# Patient Record
Sex: Female | Born: 1970
Health system: Southern US, Community
[De-identification: ages and names within clinical notes are randomized; demographics above are authoritative.]

## PROBLEM LIST (undated history)

## (undated) DIAGNOSIS — Z973 Presence of spectacles and contact lenses: Secondary | ICD-10-CM

## (undated) HISTORY — PX: WISDOM TOOTH EXTRACTION: SHX21

## (undated) HISTORY — PX: CERVICAL BIOPSY  W/ LOOP ELECTRODE EXCISION: SUR135

## (undated) HISTORY — PX: ADENOIDECTOMY: SUR15

---

## 2003-12-27 ENCOUNTER — Ambulatory Visit: Payer: Self-pay

## 2008-10-12 ENCOUNTER — Ambulatory Visit: Payer: Self-pay

## 2009-12-12 ENCOUNTER — Ambulatory Visit: Payer: Self-pay

## 2010-12-17 ENCOUNTER — Ambulatory Visit: Payer: Self-pay

## 2015-10-25 ENCOUNTER — Other Ambulatory Visit: Payer: Self-pay | Admitting: Obstetrics & Gynecology

## 2015-10-25 DIAGNOSIS — Z1231 Encounter for screening mammogram for malignant neoplasm of breast: Secondary | ICD-10-CM

## 2015-11-17 ENCOUNTER — Encounter: Payer: Self-pay | Admitting: Radiology

## 2015-11-17 ENCOUNTER — Ambulatory Visit
Admission: RE | Admit: 2015-11-17 | Discharge: 2015-11-17 | Disposition: A | Discharge status: Home / Self Care | Payer: BLUE CROSS/BLUE SHIELD | Source: Ambulatory Visit | Attending: Obstetrics & Gynecology | Admitting: Obstetrics & Gynecology

## 2015-11-17 DIAGNOSIS — Z1231 Encounter for screening mammogram for malignant neoplasm of breast: Secondary | ICD-10-CM | POA: Diagnosis present

## 2016-10-22 ENCOUNTER — Ambulatory Visit: Payer: Self-pay | Admitting: Obstetrics & Gynecology

## 2016-11-08 ENCOUNTER — Ambulatory Visit (INDEPENDENT_AMBULATORY_CARE_PROVIDER_SITE_OTHER): Payer: BLUE CROSS/BLUE SHIELD | Admitting: Obstetrics & Gynecology

## 2016-11-08 ENCOUNTER — Encounter: Payer: Self-pay | Admitting: Obstetrics & Gynecology

## 2016-11-08 VITALS — BP 110/70 | HR 78 | Ht 60.0 in | Wt 194.0 lb

## 2016-11-08 DIAGNOSIS — Z131 Encounter for screening for diabetes mellitus: Secondary | ICD-10-CM

## 2016-11-08 DIAGNOSIS — Z0000 Encounter for general adult medical examination without abnormal findings: Secondary | ICD-10-CM

## 2016-11-08 DIAGNOSIS — Z1231 Encounter for screening mammogram for malignant neoplasm of breast: Secondary | ICD-10-CM | POA: Diagnosis not present

## 2016-11-08 DIAGNOSIS — Z1239 Encounter for other screening for malignant neoplasm of breast: Secondary | ICD-10-CM

## 2016-11-08 NOTE — Patient Instructions (Signed)
PAP every three years Mammogram every year    Call [phone] to schedule at [facility] Colonoscopy every 10 years after age 48

## 2016-11-08 NOTE — Progress Notes (Signed)
HPI:      [name] is a 46 y.o. [phone] who LMP was No LMP recorded. Patient is perimenopausal., she presents today for her annual examination. The patient has no complaints today. The patient is sexually active. Her last pap: approximate date 2016 and was normal and last mammogram: approximate date 2017 and was normal. The patient does perform self breast exams.  There is no notable family history of breast or ovarian cancer in her family.  The patient has regular exercise: yes.  The patient denies current symptoms of depression. Occas hot flash.  Weight still a concern but she is still trying to adjust exercise and dietary changes.    PMHx: History reviewed. No pertinent past medical history. Past Surgical History:  Procedure Laterality Date  . ADENOIDECTOMY    . CERVICAL BIOPSY  W/ LOOP ELECTRODE EXCISION    . CESAREAN SECTION    . WISDOM TOOTH EXTRACTION     Family History  Problem Relation Age of Onset  . Cancer Paternal Aunt   . Cancer Paternal Uncle   . Diabetes Father   . Alzheimer's disease Maternal Grandmother   . Cancer Maternal Grandfather        larynx neoplasm, malignant  . Heart disease Paternal Grandfather    Social History  Substance Use Topics  . Smoking status: Never Smoker  . Smokeless tobacco: Never Used  . Alcohol use No   No current outpatient prescriptions on file. Allergies: Patient has no known allergies.  Review of Systems  Constitutional: Negative for chills, fever and malaise/fatigue.  HENT: Negative for congestion, sinus pain and sore throat.   Eyes: Negative for blurred vision and pain.  Respiratory: Negative for cough and wheezing.   Cardiovascular: Negative for chest pain and leg swelling.  Gastrointestinal: Negative for abdominal pain, constipation, diarrhea, heartburn, nausea and vomiting.  Genitourinary: Negative for dysuria, frequency, hematuria and urgency.  Musculoskeletal: Negative for back pain, joint pain, myalgias and  neck pain.  Skin: Negative for itching and rash.  Neurological: Negative for dizziness, tremors and weakness.  Endo/Heme/Allergies: Does not bruise/bleed easily.  Psychiatric/Behavioral: Negative for depression. The patient is not nervous/anxious and does not have insomnia.     Objective: BP 110/70   Pulse 78   Ht 5' (1.524 m)   Wt 194 lb (88 kg)   BMI 37.89 kg/m   Filed Weights   11/08/16 1453  Weight: 194 lb (88 kg)   Body mass index is 37.89 kg/m. Physical Exam  Constitutional: She is oriented to person, place, and time. She appears well-developed and well-nourished. No distress.  Genitourinary: Rectum normal, vagina normal and uterus normal. Pelvic exam was performed with patient supine. There is no rash or lesion on the right labia. There is no rash or lesion on the left labia. Vagina exhibits no lesion. No bleeding in the vagina. Right adnexum does not display mass and does not display tenderness. Left adnexum does not display mass and does not display tenderness. Cervix does not exhibit motion tenderness, lesion, friability or polyp.   Uterus is mobile and midaxial. Uterus is not enlarged or exhibiting a mass.  HENT:  Head: Normocephalic and atraumatic. Head is without laceration.  Right Ear: Hearing normal.  Left Ear: Hearing normal.  Nose: No epistaxis.  No foreign bodies.  Mouth/Throat: Uvula is midline, oropharynx is clear and moist and mucous membranes are normal.  Eyes: Pupils are equal, round, and reactive to light.  Neck: Normal range of motion. Neck  supple. No thyromegaly present.  Cardiovascular: Normal rate and regular rhythm.  Exam reveals no gallop and no friction rub.   No murmur heard. Pulmonary/Chest: Effort normal and breath sounds normal. No respiratory distress. She has no wheezes. Right breast exhibits no mass, no skin change and no tenderness. Left breast exhibits no mass, no skin change and no tenderness.  Abdominal: Soft. Bowel sounds are normal. She  exhibits no distension. There is no tenderness. There is no rebound.  Musculoskeletal: Normal range of motion.  Neurological: She is alert and oriented to person, place, and time. No cranial nerve deficit.  Skin: Skin is warm and dry.  Psychiatric: She has a normal mood and affect. Judgment normal.  Vitals reviewed.   Assessment:  ANNUAL EXAM 1. Annual physical exam   2. Screening for diabetes mellitus   3. Screening for breast cancer    Screening Plan:            1.  Cervical Screening-  Pap smear schedule reviewed with patient  2. Breast screening- Exam annually and mammogram>40 planned   3. Colonoscopy every 10 years, Hemoccult testing - after age 83  4. Labs Ordered today  5. Counseling for contraception: no method  Other:  1. Annual physical exam  2. Screening for diabetes mellitus - Hemoglobin A1c  3. Screening for breast cancer - MM DIGITAL SCREENING BILATERAL; Future  4. Obesity.  Counseled as to options for weight loss    F/U  Return in about 1 year (around 11/08/2017) for Annual.  Barnett Applebaum, MD, [facility], [facility] 11/08/2016  3:41 PM

## 2016-11-09 LAB — HEMOGLOBIN A1C
Est. average glucose Bld gHb Est-mCnc: 128 mg/dL
Hgb A1c MFr Bld: 6.1 % — ABNORMAL HIGH (ref 4.8–5.6)

## 2016-12-05 ENCOUNTER — Ambulatory Visit
Admission: RE | Admit: 2016-12-05 | Discharge: 2016-12-05 | Disposition: A | Discharge status: Home / Self Care | Payer: BLUE CROSS/BLUE SHIELD | Source: Ambulatory Visit | Attending: Obstetrics & Gynecology | Admitting: Obstetrics & Gynecology

## 2016-12-05 DIAGNOSIS — Z1231 Encounter for screening mammogram for malignant neoplasm of breast: Secondary | ICD-10-CM | POA: Diagnosis present

## 2016-12-05 DIAGNOSIS — Z1239 Encounter for other screening for malignant neoplasm of breast: Secondary | ICD-10-CM

## 2017-07-09 ENCOUNTER — Ambulatory Visit (INDEPENDENT_AMBULATORY_CARE_PROVIDER_SITE_OTHER): Payer: BLUE CROSS/BLUE SHIELD

## 2017-07-09 ENCOUNTER — Encounter: Payer: Self-pay | Admitting: Podiatry

## 2017-07-09 ENCOUNTER — Ambulatory Visit: Payer: BLUE CROSS/BLUE SHIELD | Admitting: Podiatry

## 2017-07-09 ENCOUNTER — Ambulatory Visit: Payer: BLUE CROSS/BLUE SHIELD

## 2017-07-09 DIAGNOSIS — M722 Plantar fascial fibromatosis: Secondary | ICD-10-CM

## 2017-07-09 MED ORDER — MELOXICAM 15 MG PO TABS: 15.0000 mg | ORAL_TABLET | Freq: Every day | ORAL | 3 refills | Status: DC

## 2017-07-09 MED ORDER — METHYLPREDNISOLONE 4 MG PO TBPK: ORAL_TABLET | ORAL | 0 refills | Status: DC

## 2017-07-09 NOTE — Progress Notes (Signed)
  Subjective:  Patient ID: Susan Duncan, female    DOB: 11-08-1970,  MRN: [id number] HPI Chief Complaint  Patient presents with  . Foot Pain    Patient presents today for right heel pain x 1 month.  She reports a constant throbbing pain when she is standing or walking and pain is much worse first thing in the mornings or when first standing after sitting a while.  She has been using inserts, stretching and gel heel cups with slight relief  . New Patient (Initial Visit)    47 y.o. female presents with the above complaint.   ROS: Denies fever chills nausea vomiting muscle aches pains calf pain back pain chest pain shortness of breath and headache.  No past medical history on file. Past Surgical History:  Procedure Laterality Date  . ADENOIDECTOMY    . CERVICAL BIOPSY  W/ LOOP ELECTRODE EXCISION    . CESAREAN SECTION    . WISDOM TOOTH EXTRACTION      Current Outpatient Medications:  Marland Kitchen  Multiple Vitamin (MULTIVITAMIN) capsule, Take 1 capsule by mouth daily., Disp: , Rfl:  .  meloxicam (MOBIC) 15 MG tablet, Take 1 tablet (15 mg total) by mouth daily., Disp: 30 tablet, Rfl: 3 .  methylPREDNISolone (MEDROL DOSEPAK) 4 MG TBPK tablet, 6 day dose pack - take as directed, Disp: 21 tablet, Rfl: 0  No Known Allergies Review of Systems Objective:  There were no vitals filed for this visit.  General: Well developed, nourished, in no acute distress, alert and oriented x3   Dermatological: Skin is warm, dry and supple bilateral. Nails x 10 are well maintained; remaining integument appears unremarkable at this time. There are no open sores, no preulcerative lesions, no rash or signs of infection present.  Vascular: Dorsalis Pedis artery and Posterior Tibial artery pedal pulses are 2/4 bilateral with immedate capillary fill time. Pedal hair growth present. No varicosities and no lower extremity edema present bilateral.   Neruologic: Grossly intact via light touch bilateral. Vibratory intact via  tuning fork bilateral. Protective threshold with Semmes Wienstein monofilament intact to all pedal sites bilateral. Patellar and Achilles deep tendon reflexes 2+ bilateral. No Babinski or clonus noted bilateral.   Musculoskeletal: No gross boney pedal deformities bilateral. No pain, crepitus, or limitation noted with foot and ankle range of motion bilateral. Muscular strength 5/5 in all groups tested bilateral.  Pain on palpation medial calcaneal tubercle of the right heel.  Gait: Unassisted, Nonantalgic.    Radiographs:  Radiographs taken today demonstrate soft tissue increase in density plantar fascial calcaneal insertion site of the right heel.  No acute findings.  Assessment & Plan:   Assessment: Plantar fasciitis right heel.  Plan: Discussed etiology pathology conservative versus surgical therapies.  At this point I injected the medial aspect of the right heel with 20 mg of Kenalog and 5 mg Marcaine after sterile Betadine skin prep.  Her on a Medrol Dosepak to be followed by meloxicam.  Plantar fascial brace and a night splint was discussed.  Gust appropriate shoe gear stretching exercise ice therapy as your modifications.  I will follow-up with her in the near future for follow-up.     Max T. [place], [place]

## 2017-07-09 NOTE — Patient Instructions (Signed)

## 2017-08-13 ENCOUNTER — Encounter: Payer: Self-pay | Admitting: Podiatry

## 2017-08-13 ENCOUNTER — Ambulatory Visit: Payer: BLUE CROSS/BLUE SHIELD | Admitting: Podiatry

## 2017-08-13 DIAGNOSIS — M722 Plantar fascial fibromatosis: Secondary | ICD-10-CM

## 2017-08-13 MED ORDER — CELECOXIB 200 MG PO CAPS: 200.0000 mg | ORAL_CAPSULE | Freq: Every day | ORAL | 3 refills | Status: DC

## 2017-08-13 NOTE — Progress Notes (Signed)
She presents today for follow-up of plantar fasciitis to the right foot.  States that is really doing well at approximately 80 to 90%.  Objective: Vital signs are stable she is alert and oriented x3.  There is no erythema edema cellulitis drainage or odor she has very little in the way of pain on palpation medial calcaneal tubercle of the right heel.  Assessment: Letter fasciitis right.  Plan: Follow-up with me as needed

## 2017-11-20 ENCOUNTER — Other Ambulatory Visit: Payer: Self-pay | Admitting: Obstetrics & Gynecology

## 2017-11-20 DIAGNOSIS — Z1231 Encounter for screening mammogram for malignant neoplasm of breast: Secondary | ICD-10-CM

## 2017-11-24 ENCOUNTER — Encounter: Payer: Self-pay | Admitting: Obstetrics & Gynecology

## 2017-11-24 ENCOUNTER — Other Ambulatory Visit (HOSPITAL_COMMUNITY)
Admission: RE | Admit: 2017-11-24 | Discharge: 2017-11-24 | Disposition: A | Discharge status: Home / Self Care | Payer: BLUE CROSS/BLUE SHIELD | Source: Ambulatory Visit | Attending: Obstetrics & Gynecology | Admitting: Obstetrics & Gynecology

## 2017-11-24 ENCOUNTER — Ambulatory Visit (INDEPENDENT_AMBULATORY_CARE_PROVIDER_SITE_OTHER): Payer: BLUE CROSS/BLUE SHIELD | Admitting: Obstetrics & Gynecology

## 2017-11-24 VITALS — BP 120/80 | Ht 61.0 in | Wt 204.0 lb

## 2017-11-24 DIAGNOSIS — Z0000 Encounter for general adult medical examination without abnormal findings: Secondary | ICD-10-CM

## 2017-11-24 DIAGNOSIS — Z1329 Encounter for screening for other suspected endocrine disorder: Secondary | ICD-10-CM

## 2017-11-24 DIAGNOSIS — Z131 Encounter for screening for diabetes mellitus: Secondary | ICD-10-CM

## 2017-11-24 DIAGNOSIS — Z01419 Encounter for gynecological examination (general) (routine) without abnormal findings: Secondary | ICD-10-CM | POA: Diagnosis not present

## 2017-11-24 DIAGNOSIS — Z124 Encounter for screening for malignant neoplasm of cervix: Secondary | ICD-10-CM | POA: Diagnosis not present

## 2017-11-24 DIAGNOSIS — Z1239 Encounter for other screening for malignant neoplasm of breast: Secondary | ICD-10-CM | POA: Diagnosis not present

## 2017-11-24 DIAGNOSIS — Z13220 Encounter for screening for lipoid disorders: Secondary | ICD-10-CM

## 2017-11-24 DIAGNOSIS — Z1321 Encounter for screening for nutritional disorder: Secondary | ICD-10-CM

## 2017-11-24 NOTE — Progress Notes (Signed)
HPI:      [name] is a 47 y.o. [id number] who LMP was No LMP recorded. Patient is perimenopausal., she presents today for her annual examination. The patient has no complaints today. The patient is sexually active. Her last pap: approximate date 2016 and was normal and last mammogram: approximate date 2018 and was normal. The patient does perform self breast exams.  There is no notable family history of breast or ovarian cancer in her family.  The patient has regular exercise: yes.  The patient denies current symptoms of depression.    GYN History: Contraception: none  PMHx: History reviewed. No pertinent past medical history. Past Surgical History:  Procedure Laterality Date  . ADENOIDECTOMY    . CERVICAL BIOPSY  W/ LOOP ELECTRODE EXCISION    . CESAREAN SECTION    . WISDOM TOOTH EXTRACTION     Family History  Problem Relation Age of Onset  . Cancer Paternal Aunt   . Cancer Paternal Uncle   . Diabetes Father   . Alzheimer's disease Maternal Grandmother   . Cancer Maternal Grandfather        larynx neoplasm, malignant  . Heart disease Paternal Grandfather    Social History   Tobacco Use  . Smoking status: Never Smoker  . Smokeless tobacco: Never Used  Substance Use Topics  . Alcohol use: No  . Drug use: No    Current Outpatient Medications:  .  celecoxib (CELEBREX) 200 MG capsule, Take 1 capsule (200 mg total) by mouth daily., Disp: 30 capsule, Rfl: 3 .  meloxicam (MOBIC) 15 MG tablet, Take 1 tablet (15 mg total) by mouth daily., Disp: 30 tablet, Rfl: 3 .  Multiple Vitamin (MULTIVITAMIN) capsule, Take 1 capsule by mouth daily., Disp: , Rfl:  Allergies: Patient has no known allergies.  Review of Systems  Constitutional: Negative for chills, fever and malaise/fatigue.  HENT: Negative for congestion, sinus pain and sore throat.   Eyes: Negative for blurred vision and pain.  Respiratory: Negative for cough and wheezing.   Cardiovascular: Negative for chest pain and  leg swelling.  Gastrointestinal: Positive for constipation. Negative for abdominal pain, diarrhea, heartburn, nausea and vomiting.  Genitourinary: Negative for dysuria, frequency, hematuria and urgency.  Musculoskeletal: Negative for back pain, joint pain, myalgias and neck pain.  Skin: Negative for itching and rash.  Neurological: Negative for dizziness, tremors and weakness.  Endo/Heme/Allergies: Does not bruise/bleed easily.  Psychiatric/Behavioral: Negative for depression. The patient is not nervous/anxious and does not have insomnia.    Objective: BP 120/80   Ht 5\' 1"  (1.549 m)   Wt 204 lb (92.5 kg)   BMI 38.55 kg/m   Filed Weights   11/24/17 1601  Weight: 204 lb (92.5 kg)   Body mass index is 38.55 kg/m. Physical Exam  Constitutional: She is oriented to person, place, and time. She appears well-developed and well-nourished. No distress.  Genitourinary: Rectum normal, vagina normal and uterus normal. Pelvic exam was performed with patient supine. There is no rash or lesion on the right labia. There is no rash or lesion on the left labia. Vagina exhibits no lesion. No bleeding in the vagina. Right adnexum does not display mass and does not display tenderness. Left adnexum does not display mass and does not display tenderness. Cervix does not exhibit motion tenderness, lesion, friability or polyp.   Uterus is mobile and midaxial. Uterus is not enlarged or exhibiting a mass.  HENT:  Head: Normocephalic and atraumatic. Head is without laceration.  Right Ear: Hearing normal.  Left Ear: Hearing normal.  Nose: No epistaxis.  No foreign bodies.  Mouth/Throat: Uvula is midline, oropharynx is clear and moist and mucous membranes are normal.  Eyes: Pupils are equal, round, and reactive to light.  Neck: Normal range of motion. Neck supple. No thyromegaly present.  Cardiovascular: Normal rate and regular rhythm. Exam reveals no gallop and no friction rub.  No murmur heard. Pulmonary/Chest:  Effort normal and breath sounds normal. No respiratory distress. She has no wheezes. Right breast exhibits no mass, no skin change and no tenderness. Left breast exhibits no mass, no skin change and no tenderness.  Abdominal: Soft. Bowel sounds are normal. She exhibits no distension. There is no tenderness. There is no rebound.  Musculoskeletal: Normal range of motion.  Neurological: She is alert and oriented to person, place, and time. No cranial nerve deficit.  Skin: Skin is warm and dry.  Psychiatric: She has a normal mood and affect. Judgment normal.  Vitals reviewed.  Assessment:  ANNUAL EXAM 1. Annual physical exam   2. Screening for cervical cancer   3. Screening for breast cancer   4. Encounter for vitamin deficiency screening   5. Screening for thyroid disorder   6. Screening for diabetes mellitus   7. Screening for cholesterol level    Screening Plan:            1.  Cervical Screening-  Pap smear done today  2. Breast screening- Exam annually and mammogram>40 planned   3. Colonoscopy every 10 years, Hemoccult testing - after age 26 (next year)  4. Labs To return fasting at a later date  5. Counseling for contraception: no method  Other:  1. Annual physical exam  2. Screening for cervical cancer - Cytology - PAP  3. Screening for breast cancer  4. Encounter for vitamin deficiency screening - VITAMIN D 25 Hydroxy (Vit-D Deficiency, Fractures)  5. Screening for thyroid disorder - TSH  6. Screening for diabetes mellitus - Hemoglobin A1c  7. Screening for cholesterol level - Lipid panel  8. Flu shot at work  9. Rec dermatology evaluation  10.  Constipation options discussed    F/U  Return in about 1 year (around 11/25/2018) for Annual.  Barnett Applebaum, MD, [facility], [facility] 11/24/2017  4:23 PM

## 2017-11-24 NOTE — Patient Instructions (Addendum)
PAP every three years Mammogram every year Colonoscopy every 10 years starting 2020 Labs soon

## 2017-11-27 LAB — CYTOLOGY - PAP
Adequacy: ABSENT
DIAGNOSIS: NEGATIVE
HPV (WINDOPATH): NOT DETECTED

## 2017-11-28 ENCOUNTER — Other Ambulatory Visit: Payer: BLUE CROSS/BLUE SHIELD

## 2017-11-28 DIAGNOSIS — Z13220 Encounter for screening for lipoid disorders: Secondary | ICD-10-CM | POA: Diagnosis not present

## 2017-11-28 DIAGNOSIS — Z1329 Encounter for screening for other suspected endocrine disorder: Secondary | ICD-10-CM | POA: Diagnosis not present

## 2017-11-28 DIAGNOSIS — Z131 Encounter for screening for diabetes mellitus: Secondary | ICD-10-CM | POA: Diagnosis not present

## 2017-11-28 DIAGNOSIS — Z1321 Encounter for screening for nutritional disorder: Secondary | ICD-10-CM | POA: Diagnosis not present

## 2017-11-29 LAB — HEMOGLOBIN A1C
ESTIMATED AVERAGE GLUCOSE: 131 mg/dL
HEMOGLOBIN A1C: 6.2 % — AB (ref 4.8–5.6)

## 2017-11-29 LAB — TSH: TSH: 2.14 u[IU]/mL (ref 0.450–4.500)

## 2017-11-29 LAB — VITAMIN D 25 HYDROXY (VIT D DEFICIENCY, FRACTURES): VIT D 25 HYDROXY: 28.5 ng/mL — AB (ref 30.0–100.0)

## 2017-11-29 LAB — LIPID PANEL
Chol/HDL Ratio: 3.4 ratio (ref 0.0–4.4)
Cholesterol, Total: 203 mg/dL — ABNORMAL HIGH (ref 100–199)
HDL: 59 mg/dL (ref >39)
LDL Calculated: 120 mg/dL — ABNORMAL HIGH (ref 0–99)
Triglycerides: 122 mg/dL (ref 0–149)
VLDL Cholesterol Cal: 24 mg/dL (ref 5–40)

## 2017-12-10 ENCOUNTER — Ambulatory Visit
Admission: RE | Admit: 2017-12-10 | Discharge: 2017-12-10 | Disposition: A | Discharge status: Home / Self Care | Payer: BLUE CROSS/BLUE SHIELD | Source: Ambulatory Visit | Attending: Obstetrics & Gynecology | Admitting: Obstetrics & Gynecology

## 2017-12-10 DIAGNOSIS — Z1231 Encounter for screening mammogram for malignant neoplasm of breast: Secondary | ICD-10-CM | POA: Diagnosis not present

## 2018-03-20 DIAGNOSIS — J069 Acute upper respiratory infection, unspecified: Secondary | ICD-10-CM | POA: Diagnosis not present

## 2018-03-20 DIAGNOSIS — J209 Acute bronchitis, unspecified: Secondary | ICD-10-CM | POA: Diagnosis not present

## 2018-11-13 ENCOUNTER — Other Ambulatory Visit: Payer: Self-pay | Admitting: Obstetrics & Gynecology

## 2018-11-13 DIAGNOSIS — Z1231 Encounter for screening mammogram for malignant neoplasm of breast: Secondary | ICD-10-CM

## 2018-12-07 ENCOUNTER — Other Ambulatory Visit: Payer: Self-pay

## 2018-12-07 ENCOUNTER — Ambulatory Visit (INDEPENDENT_AMBULATORY_CARE_PROVIDER_SITE_OTHER): Payer: BC Managed Care – PPO | Admitting: Obstetrics & Gynecology

## 2018-12-07 ENCOUNTER — Encounter: Payer: Self-pay | Admitting: Obstetrics & Gynecology

## 2018-12-07 VITALS — BP 120/80 | Ht 60.0 in | Wt 201.0 lb

## 2018-12-07 DIAGNOSIS — Z01419 Encounter for gynecological examination (general) (routine) without abnormal findings: Secondary | ICD-10-CM | POA: Diagnosis not present

## 2018-12-07 DIAGNOSIS — E7800 Pure hypercholesterolemia, unspecified: Secondary | ICD-10-CM

## 2018-12-07 DIAGNOSIS — E559 Vitamin D deficiency, unspecified: Secondary | ICD-10-CM | POA: Diagnosis not present

## 2018-12-07 DIAGNOSIS — Z131 Encounter for screening for diabetes mellitus: Secondary | ICD-10-CM | POA: Diagnosis not present

## 2018-12-07 DIAGNOSIS — Z1211 Encounter for screening for malignant neoplasm of colon: Secondary | ICD-10-CM

## 2018-12-07 NOTE — Progress Notes (Signed)
HPI:      [name] is a 48 y.o. [id number] who LMP was in the past, she presents today for her annual examination.  The patient has no complaints today. The patient is sexually active. Herlast pap: approximate date 2019 and was normal and last mammogram: approximate date 2019 and was normal.  The patient does perform self breast exams.  There is no notable family history of breast or ovarian cancer in her family. The patient is not taking hormone replacement therapy. Patient denies post-menopausal vaginal bleeding.   The patient has regular exercise: yes. The patient denies current symptoms of depression.    GYN Hx: Last Colonoscopy:never   PMHx: History reviewed. No pertinent past medical history. Past Surgical History:  Procedure Laterality Date  . ADENOIDECTOMY    . CERVICAL BIOPSY  W/ LOOP ELECTRODE EXCISION    . CESAREAN SECTION    . WISDOM TOOTH EXTRACTION     Family History  Problem Relation Age of Onset  . Cancer Paternal Aunt   . Cancer Paternal Uncle   . Diabetes Father   . Alzheimer's disease Maternal Grandmother   . Cancer Maternal Grandfather        larynx neoplasm, malignant  . Heart disease Paternal Grandfather   . Breast cancer Neg Hx    Social History   Tobacco Use  . Smoking status: Never Smoker  . Smokeless tobacco: Never Used  Substance Use Topics  . Alcohol use: No  . Drug use: No    Current Outpatient Medications:  Marland Kitchen  Multiple Vitamin (MULTIVITAMIN) capsule, Take 1 capsule by mouth daily., Disp: , Rfl:  .  celecoxib (CELEBREX) 200 MG capsule, Take 1 capsule (200 mg total) by mouth daily. (Patient not taking: Reported on 12/07/2018), Disp: 30 capsule, Rfl: 3 .  meloxicam (MOBIC) 15 MG tablet, Take 1 tablet (15 mg total) by mouth daily. (Patient not taking: Reported on 12/07/2018), Disp: 30 tablet, Rfl: 3 Allergies: Patient has no known allergies.  Review of Systems  Constitutional: Negative for chills, fever and malaise/fatigue.  HENT: Negative  for congestion, sinus pain and sore throat.   Eyes: Negative for blurred vision and pain.  Respiratory: Negative for cough and wheezing.   Cardiovascular: Negative for chest pain and leg swelling.  Gastrointestinal: Negative for abdominal pain, constipation, diarrhea, heartburn, nausea and vomiting.  Genitourinary: Negative for dysuria, frequency, hematuria and urgency.  Musculoskeletal: Negative for back pain, joint pain, myalgias and neck pain.  Skin: Negative for itching and rash.  Neurological: Negative for dizziness, tremors and weakness.  Endo/Heme/Allergies: Does not bruise/bleed easily.  Psychiatric/Behavioral: Negative for depression. The patient is not nervous/anxious and does not have insomnia.     Objective: BP 120/80   Ht 5' (1.524 m)   Wt 201 lb (91.2 kg)   BMI 39.26 kg/m   Filed Weights   12/07/18 1429  Weight: 201 lb (91.2 kg)   Body mass index is 39.26 kg/m. Physical Exam Constitutional:      General: She is not in acute distress.    Appearance: She is well-developed.  Genitourinary:     Pelvic exam was performed with patient supine.     Vagina, uterus and rectum normal.     No lesions in the vagina.     No vaginal bleeding.     No cervical motion tenderness, friability, lesion or polyp.     Uterus is mobile.     Uterus is not enlarged.     No uterine mass  detected.    Uterus is midaxial.     No right or left adnexal mass present.     Right adnexa not tender.     Left adnexa not tender.  HENT:     Head: Normocephalic and atraumatic. No laceration.     Right Ear: Hearing normal.     Left Ear: Hearing normal.     Mouth/Throat:     Pharynx: Uvula midline.  Eyes:     Pupils: Pupils are equal, round, and reactive to light.  Neck:     Musculoskeletal: Normal range of motion and neck supple.     Thyroid: No thyromegaly.  Cardiovascular:     Rate and Rhythm: Normal rate and regular rhythm.     Heart sounds: No murmur. No friction rub. No gallop.    Pulmonary:     Effort: Pulmonary effort is normal. No respiratory distress.     Breath sounds: Normal breath sounds. No wheezing.  Chest:     Breasts:        Right: No mass, skin change or tenderness.        Left: No mass, skin change or tenderness.  Abdominal:     General: Bowel sounds are normal. There is no distension.     Palpations: Abdomen is soft.     Tenderness: There is no abdominal tenderness. There is no rebound.  Musculoskeletal: Normal range of motion.  Neurological:     Mental Status: She is alert and oriented to person, place, and time.     Cranial Nerves: No cranial nerve deficit.  Skin:    General: Skin is warm and dry.  Psychiatric:        Judgment: Judgment normal.  Vitals signs reviewed.     Assessment: Annual Exam 1. Women's annual routine gynecological examination   2. Screening for diabetes mellitus   3. Hypercholesterolemia   4. Vitamin D deficiency   5. Screen for colon cancer     Plan:            1.  Cervical Screening-  Pap smear schedule reviewed with patient  2. Breast screening- Exam annually and mammogram scheduled  3. Colonoscopy every 10 years, Hemoccult testing after age 86  4. Labs Ordered today  5. Counseling for hormonal therapy: none              6. Dermatology recommended for screening exam    F/U  Return in about 1 year (around 12/07/2019) for Annual.  Barnett Applebaum, MD, [facility], [facility] 12/07/2018  3:17 PM

## 2018-12-07 NOTE — Patient Instructions (Signed)
PAP every three years Mammogram every year Colonoscopy every 10 years Labs today

## 2018-12-08 LAB — LIPID PANEL
Chol/HDL Ratio: 4.4 ratio (ref 0.0–4.4)
Cholesterol, Total: 222 mg/dL — ABNORMAL HIGH (ref 100–199)
HDL: 50 mg/dL (ref >39)
LDL Chol Calc (NIH): 120 mg/dL — ABNORMAL HIGH (ref 0–99)
Triglycerides: 296 mg/dL — ABNORMAL HIGH (ref 0–149)
VLDL Cholesterol Cal: 52 mg/dL — ABNORMAL HIGH (ref 5–40)

## 2018-12-08 LAB — VITAMIN D 25 HYDROXY (VIT D DEFICIENCY, FRACTURES): Vit D, 25-Hydroxy: 31.9 ng/mL (ref 30.0–100.0)

## 2018-12-08 LAB — HEMOGLOBIN A1C
Est. average glucose Bld gHb Est-mCnc: 128 mg/dL
Hgb A1c MFr Bld: 6.1 % — ABNORMAL HIGH (ref 4.8–5.6)

## 2018-12-14 ENCOUNTER — Telehealth: Payer: Self-pay

## 2018-12-14 ENCOUNTER — Other Ambulatory Visit: Payer: Self-pay

## 2018-12-14 NOTE — Telephone Encounter (Signed)
Gastroenterology Pre-Procedure Review  Request Date: PENDING PT CALL BACK Requesting Physician: Dr. [name]  PATIENT REVIEW QUESTIONS: The patient responded to the following health history questions as indicated:    1. Are you having any GI issues? no 2. Do you have a personal history of Polyps? no 3. Do you have a family history of Colon Cancer or Polyps? no 4. Diabetes Mellitus? no 5. Joint replacements in the past 12 months?no 6. Major health problems in the past 3 months?no 7. Any artificial heart valves, MVP, or defibrillator?no    MEDICATIONS & ALLERGIES:    Patient reports the following regarding taking any anticoagulation/antiplatelet therapy:   Plavix, Coumadin, Eliquis, Xarelto, Lovenox, Pradaxa, Brilinta, or Effient? no Aspirin? no  Patient confirms/reports the following medications:  Current Outpatient Medications  Medication Sig Dispense Refill  . celecoxib (CELEBREX) 200 MG capsule Take 1 capsule (200 mg total) by mouth daily. (Patient not taking: Reported on 12/07/2018) 30 capsule 3  . meloxicam (MOBIC) 15 MG tablet Take 1 tablet (15 mg total) by mouth daily. (Patient not taking: Reported on 12/07/2018) 30 tablet 3  . Multiple Vitamin (MULTIVITAMIN) capsule Take 1 capsule by mouth daily.     No current facility-administered medications for this visit.     Patient confirms/reports the following allergies:  No Known Allergies  No orders of the defined types were placed in this encounter.   AUTHORIZATION INFORMATION Primary Insurance: 1D#: Group #:  Secondary Insurance: 1D#: Group #:  SCHEDULE INFORMATION: Date:  Time: Location:

## 2018-12-23 ENCOUNTER — Ambulatory Visit
Admission: RE | Admit: 2018-12-23 | Discharge: 2018-12-23 | Disposition: A | Discharge status: Home / Self Care | Payer: BC Managed Care – PPO | Source: Ambulatory Visit | Attending: Obstetrics & Gynecology | Admitting: Obstetrics & Gynecology

## 2018-12-23 DIAGNOSIS — Z1231 Encounter for screening mammogram for malignant neoplasm of breast: Secondary | ICD-10-CM | POA: Insufficient documentation

## 2018-12-24 ENCOUNTER — Encounter: Payer: Self-pay | Admitting: *Deleted

## 2019-11-24 ENCOUNTER — Other Ambulatory Visit: Payer: Self-pay | Admitting: Obstetrics & Gynecology

## 2019-11-24 DIAGNOSIS — Z1231 Encounter for screening mammogram for malignant neoplasm of breast: Secondary | ICD-10-CM

## 2019-12-10 ENCOUNTER — Other Ambulatory Visit: Payer: Self-pay

## 2019-12-10 ENCOUNTER — Ambulatory Visit (INDEPENDENT_AMBULATORY_CARE_PROVIDER_SITE_OTHER): Payer: BC Managed Care – PPO | Admitting: Obstetrics & Gynecology

## 2019-12-10 ENCOUNTER — Encounter: Payer: Self-pay | Admitting: Obstetrics & Gynecology

## 2019-12-10 VITALS — BP 120/80 | Ht 60.0 in | Wt 186.0 lb

## 2019-12-10 DIAGNOSIS — E7800 Pure hypercholesterolemia, unspecified: Secondary | ICD-10-CM

## 2019-12-10 DIAGNOSIS — E559 Vitamin D deficiency, unspecified: Secondary | ICD-10-CM

## 2019-12-10 DIAGNOSIS — Z01419 Encounter for gynecological examination (general) (routine) without abnormal findings: Secondary | ICD-10-CM | POA: Diagnosis not present

## 2019-12-10 DIAGNOSIS — Z1329 Encounter for screening for other suspected endocrine disorder: Secondary | ICD-10-CM

## 2019-12-10 DIAGNOSIS — Z131 Encounter for screening for diabetes mellitus: Secondary | ICD-10-CM | POA: Diagnosis not present

## 2019-12-10 NOTE — Progress Notes (Signed)
HPI:      [name] is a 49 y.o. [id number] who LMP was in the past, she presents today for her annual examination.  The patient has no complaints today. The patient is sexually active. Herlast pap: approximate date 2019 and was normal and last mammogram: approximate date 2020 and was normal.  The patient does perform self breast exams.  There is no notable family history of breast or ovarian cancer in her family. The patient is not taking hormone replacement therapy. Patient denies post-menopausal vaginal bleeding.   The patient has regular exercise: yes. The patient denies current symptoms of depression.    GYN Hx: Colonoscopy:Overdue, plans to early 2022.   PMHx: History reviewed. No pertinent past medical history. Past Surgical History:  Procedure Laterality Date  . ADENOIDECTOMY    . CERVICAL BIOPSY  W/ LOOP ELECTRODE EXCISION    . CESAREAN SECTION    . WISDOM TOOTH EXTRACTION     Family History  Problem Relation Age of Onset  . Cancer Paternal Aunt   . Cancer Paternal Uncle   . Diabetes Father   . Alzheimer's disease Maternal Grandmother   . Cancer Maternal Grandfather        larynx neoplasm, malignant  . Heart disease Paternal Grandfather   . Breast cancer Neg Hx    Social History   Tobacco Use  . Smoking status: Never Smoker  . Smokeless tobacco: Never Used  Vaping Use  . Vaping Use: Never used  Substance Use Topics  . Alcohol use: No  . Drug use: No    Current Outpatient Medications:  Marland Kitchen  Multiple Vitamin (MULTIVITAMIN) capsule, Take 1 capsule by mouth daily., Disp: , Rfl:  Allergies: Patient has no known allergies.  Review of Systems  Constitutional: Negative for chills, fever and malaise/fatigue.  HENT: Negative for congestion, sinus pain and sore throat.   Eyes: Negative for blurred vision and pain.  Respiratory: Negative for cough and wheezing.   Cardiovascular: Negative for chest pain and leg swelling.  Gastrointestinal: Negative for abdominal pain,  constipation, diarrhea, heartburn, nausea and vomiting.  Genitourinary: Negative for dysuria, frequency, hematuria and urgency.  Musculoskeletal: Negative for back pain, joint pain, myalgias and neck pain.  Skin: Negative for itching and rash.  Neurological: Negative for dizziness, tremors and weakness.  Endo/Heme/Allergies: Does not bruise/bleed easily.  Psychiatric/Behavioral: Negative for depression. The patient is not nervous/anxious and does not have insomnia.     Objective: BP 120/80   Ht 5' (1.524 m)   Wt 186 lb (84.4 kg)   LMP 09/17/2015   BMI 36.33 kg/m   Filed Weights   12/10/19 1519  Weight: 186 lb (84.4 kg)   Body mass index is 36.33 kg/m. Physical Exam Constitutional:      General: She is not in acute distress.    Appearance: She is well-developed.  Genitourinary:     Pelvic exam was performed with patient supine.     Vagina, uterus and rectum normal.     No lesions in the vagina.     No vaginal bleeding.     No cervical motion tenderness, friability, lesion or polyp.     Uterus is mobile.     Uterus is not enlarged.     No uterine mass detected.    Uterus is midaxial.     No right or left adnexal mass present.     Right adnexa not tender.     Left adnexa not tender.  HENT:  Head: Normocephalic and atraumatic. No laceration.     Right Ear: Hearing normal.     Left Ear: Hearing normal.     Mouth/Throat:     Pharynx: Uvula midline.  Eyes:     Pupils: Pupils are equal, round, and reactive to light.  Neck:     Thyroid: No thyromegaly.  Cardiovascular:     Rate and Rhythm: Normal rate and regular rhythm.     Heart sounds: No murmur heard.  No friction rub. No gallop.   Pulmonary:     Effort: Pulmonary effort is normal. No respiratory distress.     Breath sounds: Normal breath sounds. No wheezing.  Chest:     Breasts:        Right: No mass, skin change or tenderness.        Left: No mass, skin change or tenderness.  Abdominal:     General: Bowel  sounds are normal. There is no distension.     Palpations: Abdomen is soft.     Tenderness: There is no abdominal tenderness. There is no rebound.  Musculoskeletal:        General: Normal range of motion.     Cervical back: Normal range of motion and neck supple.  Neurological:     Mental Status: She is alert and oriented to person, place, and time.     Cranial Nerves: No cranial nerve deficit.  Skin:    General: Skin is warm and dry.  Psychiatric:        Judgment: Judgment normal.  Vitals reviewed.     Assessment: Annual Exam 1. Women's annual routine gynecological examination   2. Screening for diabetes mellitus   3. Hypercholesterolemia   4. Vitamin D deficiency   5. Screening for thyroid disorder     Plan:            1.  Cervical Screening-  Pap smear schedule reviewed with patient  2. Breast screening- Exam annually and mammogram scheduled  3. Colonoscopy every 10 years, Hemoccult testing after age 59  4. Labs To return fasting at a later date  5. Counseling for hormonal therapy: none              6. FRAX - FRAX score for assessing the 10 year probability for fracture calculated and discussed today.  Based on age and score today, DEXA is not currently scheduled.    F/U  Return in about 1 year (around 12/09/2020) for Annual, and LAB APPT SOON.  Barnett Applebaum, MD, [facility], [facility] 12/10/2019  4:03 PM

## 2019-12-10 NOTE — Patient Instructions (Signed)
PAP every three years Mammogram every year Colonoscopy every 10 years Labs  Thank you for choosing [facility]. As part of our ongoing efforts to improve patient experience, we would appreciate your feedback. Please fill out the short survey that you will receive by mail or MyChart. Your opinion is important to [place]! - Dr. Kenton Kingfisher

## 2019-12-15 ENCOUNTER — Other Ambulatory Visit: Payer: Self-pay

## 2019-12-15 ENCOUNTER — Other Ambulatory Visit: Payer: BC Managed Care – PPO

## 2019-12-15 DIAGNOSIS — E559 Vitamin D deficiency, unspecified: Secondary | ICD-10-CM | POA: Diagnosis not present

## 2019-12-15 DIAGNOSIS — Z131 Encounter for screening for diabetes mellitus: Secondary | ICD-10-CM

## 2019-12-15 DIAGNOSIS — Z1329 Encounter for screening for other suspected endocrine disorder: Secondary | ICD-10-CM

## 2019-12-15 DIAGNOSIS — E7800 Pure hypercholesterolemia, unspecified: Secondary | ICD-10-CM | POA: Diagnosis not present

## 2019-12-16 LAB — LIPID PANEL
Chol/HDL Ratio: 3.6 ratio (ref 0.0–4.4)
Cholesterol, Total: 193 mg/dL (ref 100–199)
HDL: 54 mg/dL (ref >39)
LDL Chol Calc (NIH): 111 mg/dL — ABNORMAL HIGH (ref 0–99)
Triglycerides: 163 mg/dL — ABNORMAL HIGH (ref 0–149)
VLDL Cholesterol Cal: 28 mg/dL (ref 5–40)

## 2019-12-16 LAB — TSH: TSH: 2.29 u[IU]/mL (ref 0.450–4.500)

## 2019-12-16 LAB — VITAMIN D 25 HYDROXY (VIT D DEFICIENCY, FRACTURES): Vit D, 25-Hydroxy: 44.6 ng/mL (ref 30.0–100.0)

## 2019-12-16 LAB — HEMOGLOBIN A1C
Est. average glucose Bld gHb Est-mCnc: 126 mg/dL
Hgb A1c MFr Bld: 6 % — ABNORMAL HIGH (ref 4.8–5.6)

## 2019-12-24 ENCOUNTER — Other Ambulatory Visit: Payer: Self-pay

## 2019-12-24 ENCOUNTER — Ambulatory Visit
Admission: RE | Admit: 2019-12-24 | Discharge: 2019-12-24 | Disposition: A | Discharge status: Home / Self Care | Payer: BC Managed Care – PPO | Source: Ambulatory Visit | Attending: Obstetrics & Gynecology | Admitting: Obstetrics & Gynecology

## 2019-12-24 DIAGNOSIS — Z1231 Encounter for screening mammogram for malignant neoplasm of breast: Secondary | ICD-10-CM | POA: Diagnosis not present

## 2020-01-04 DIAGNOSIS — D123 Benign neoplasm of transverse colon: Secondary | ICD-10-CM | POA: Diagnosis not present

## 2020-03-01 DIAGNOSIS — B028 Zoster with other complications: Secondary | ICD-10-CM | POA: Diagnosis not present

## 2020-03-09 DIAGNOSIS — B028 Zoster with other complications: Secondary | ICD-10-CM | POA: Diagnosis not present

## 2020-03-12 DIAGNOSIS — B029 Zoster without complications: Secondary | ICD-10-CM | POA: Diagnosis not present

## 2020-03-20 ENCOUNTER — Telehealth: Payer: Self-pay | Admitting: Gastroenterology

## 2020-03-20 NOTE — Telephone Encounter (Signed)
Called patient to explain that they need a new referral from their PCP before we can make an appointment.  Unable to reach or LM because VM is not set up

## 2020-03-27 ENCOUNTER — Telehealth: Payer: Self-pay | Admitting: Gastroenterology

## 2020-03-27 NOTE — Telephone Encounter (Signed)
Patient called LM on VM to call back.  Attempted to call patient, unable to leave a message because the voicemail is full.

## 2020-04-04 ENCOUNTER — Other Ambulatory Visit: Payer: Self-pay | Admitting: Obstetrics & Gynecology

## 2020-04-04 ENCOUNTER — Telehealth: Payer: Self-pay

## 2020-04-04 DIAGNOSIS — Z1211 Encounter for screening for malignant neoplasm of colon: Secondary | ICD-10-CM

## 2020-04-04 NOTE — Telephone Encounter (Signed)
Pt aware.

## 2020-04-04 NOTE — Telephone Encounter (Signed)
Pt calling; needs updated/new referral for colonoscopy; has called [place] office 4 times to schedule and they have not responded; pt guessing it's because the ref is old.  C# [phone] (VM full)  W# [phone] ok to leave vm

## 2020-05-01 ENCOUNTER — Encounter: Payer: Self-pay | Admitting: *Deleted

## 2020-05-08 ENCOUNTER — Other Ambulatory Visit: Payer: Self-pay

## 2020-05-08 ENCOUNTER — Telehealth (INDEPENDENT_AMBULATORY_CARE_PROVIDER_SITE_OTHER): Payer: Self-pay | Admitting: Gastroenterology

## 2020-05-08 DIAGNOSIS — Z1211 Encounter for screening for malignant neoplasm of colon: Secondary | ICD-10-CM

## 2020-05-08 MED ORDER — PEG 3350-KCL-NA BICARB-NACL 420 G PO SOLR: 4000.0000 mL | Freq: Once | ORAL | 0 refills | Status: AC

## 2020-05-08 NOTE — Progress Notes (Signed)
Gastroenterology Pre-Procedure Review  Request Date: 05/18/20 Requesting Physician: Dr. [name]  PATIENT REVIEW QUESTIONS: The patient responded to the following health history questions as indicated:    1. Are you having any GI issues? no 2. Do you have a personal history of Polyps? no 3. Do you have a family history of Colon Cancer or Polyps? no 4. Diabetes Mellitus? no 5. Joint replacements in the past 12 months?no 6. Major health problems in the past 3 months?no 7. Any artificial heart valves, MVP, or defibrillator?no    MEDICATIONS & ALLERGIES:    Patient reports the following regarding taking any anticoagulation/antiplatelet therapy:   Plavix, Coumadin, Eliquis, Xarelto, Lovenox, Pradaxa, Brilinta, or Effient? no Aspirin? no  Patient confirms/reports the following medications:  Current Outpatient Medications  Medication Sig Dispense Refill  . Multiple Vitamin (MULTIVITAMIN) capsule Take 1 capsule by mouth daily.    . polyethylene glycol-electrolytes (NULYTELY) 420 g solution Take 4,000 mLs by mouth once for 1 dose. Fill container to the fill line with clear liquid.  Mix well.  Drink 8 oz every 30 minutes until entire contents have been completed.  Do not eat or drink anything 4 hours before colonoscopy. 4000 mL 0   No current facility-administered medications for this visit.    Patient confirms/reports the following allergies:  No Known Allergies  Orders Placed This Encounter  Procedures  . Procedural/ Surgical Case Request: COLONOSCOPY WITH PROPOFOL    Standing Status:   Standing    Number of Occurrences:   1    Order Specific Question:   Pre-op diagnosis    Answer:   screening colonoscopy    Order Specific Question:   CPT Code    Answer:   34742    AUTHORIZATION INFORMATION Primary Insurance: 1D#: Group #:  Secondary Insurance: 1D#: Group #:  SCHEDULE INFORMATION: Date: 05/18/20 Time: Location:[facility]

## 2020-05-11 ENCOUNTER — Other Ambulatory Visit: Payer: Self-pay

## 2020-05-11 ENCOUNTER — Encounter: Payer: Self-pay | Admitting: Gastroenterology

## 2020-05-16 ENCOUNTER — Other Ambulatory Visit
Admission: RE | Admit: 2020-05-16 | Discharge: 2020-05-16 | Disposition: A | Discharge status: Home / Self Care | Payer: BC Managed Care – PPO | Source: Ambulatory Visit | Attending: Gastroenterology | Admitting: Gastroenterology

## 2020-05-16 ENCOUNTER — Other Ambulatory Visit: Payer: Self-pay

## 2020-05-16 DIAGNOSIS — Z20822 Contact with and (suspected) exposure to covid-19: Secondary | ICD-10-CM | POA: Insufficient documentation

## 2020-05-16 DIAGNOSIS — Z1211 Encounter for screening for malignant neoplasm of colon: Secondary | ICD-10-CM | POA: Diagnosis not present

## 2020-05-16 DIAGNOSIS — Z79899 Other long term (current) drug therapy: Secondary | ICD-10-CM | POA: Diagnosis not present

## 2020-05-16 DIAGNOSIS — Z01812 Encounter for preprocedural laboratory examination: Secondary | ICD-10-CM | POA: Insufficient documentation

## 2020-05-16 DIAGNOSIS — Z809 Family history of malignant neoplasm, unspecified: Secondary | ICD-10-CM | POA: Diagnosis not present

## 2020-05-16 DIAGNOSIS — Z833 Family history of diabetes mellitus: Secondary | ICD-10-CM | POA: Diagnosis not present

## 2020-05-16 DIAGNOSIS — Z8249 Family history of ischemic heart disease and other diseases of the circulatory system: Secondary | ICD-10-CM | POA: Diagnosis not present

## 2020-05-16 DIAGNOSIS — Z820 Family history of epilepsy and other diseases of the nervous system: Secondary | ICD-10-CM | POA: Diagnosis not present

## 2020-05-16 DIAGNOSIS — K640 First degree hemorrhoids: Secondary | ICD-10-CM | POA: Diagnosis not present

## 2020-05-16 DIAGNOSIS — Z802 Family history of malignant neoplasm of other respiratory and intrathoracic organs: Secondary | ICD-10-CM | POA: Diagnosis not present

## 2020-05-16 DIAGNOSIS — K5730 Diverticulosis of large intestine without perforation or abscess without bleeding: Secondary | ICD-10-CM | POA: Diagnosis not present

## 2020-05-16 LAB — SARS CORONAVIRUS 2 (TAT 6-24 HRS): SARS Coronavirus 2: NEGATIVE

## 2020-05-17 NOTE — Discharge Instructions (Signed)

## 2020-05-18 ENCOUNTER — Ambulatory Visit
Admission: RE | Admit: 2020-05-18 | Discharge: 2020-05-18 | Disposition: A | Discharge status: Home / Self Care | Payer: BC Managed Care – PPO | Attending: Gastroenterology | Admitting: Gastroenterology

## 2020-05-18 ENCOUNTER — Ambulatory Visit: Payer: BC Managed Care – PPO | Admitting: Anesthesiology

## 2020-05-18 ENCOUNTER — Encounter: Payer: Self-pay | Admitting: Gastroenterology

## 2020-05-18 ENCOUNTER — Encounter
Admission: RE | Disposition: A | Discharge status: Home / Self Care | Payer: Self-pay | Source: Home / Self Care | Attending: Gastroenterology

## 2020-05-18 ENCOUNTER — Other Ambulatory Visit: Payer: Self-pay

## 2020-05-18 DIAGNOSIS — K5730 Diverticulosis of large intestine without perforation or abscess without bleeding: Secondary | ICD-10-CM | POA: Insufficient documentation

## 2020-05-18 DIAGNOSIS — Z833 Family history of diabetes mellitus: Secondary | ICD-10-CM | POA: Diagnosis not present

## 2020-05-18 DIAGNOSIS — Z8249 Family history of ischemic heart disease and other diseases of the circulatory system: Secondary | ICD-10-CM | POA: Diagnosis not present

## 2020-05-18 DIAGNOSIS — Z820 Family history of epilepsy and other diseases of the nervous system: Secondary | ICD-10-CM | POA: Insufficient documentation

## 2020-05-18 DIAGNOSIS — Z802 Family history of malignant neoplasm of other respiratory and intrathoracic organs: Secondary | ICD-10-CM | POA: Insufficient documentation

## 2020-05-18 DIAGNOSIS — Z20822 Contact with and (suspected) exposure to covid-19: Secondary | ICD-10-CM | POA: Insufficient documentation

## 2020-05-18 DIAGNOSIS — Z1211 Encounter for screening for malignant neoplasm of colon: Secondary | ICD-10-CM | POA: Diagnosis not present

## 2020-05-18 DIAGNOSIS — K640 First degree hemorrhoids: Secondary | ICD-10-CM | POA: Insufficient documentation

## 2020-05-18 DIAGNOSIS — Z79899 Other long term (current) drug therapy: Secondary | ICD-10-CM | POA: Diagnosis not present

## 2020-05-18 DIAGNOSIS — Z809 Family history of malignant neoplasm, unspecified: Secondary | ICD-10-CM | POA: Diagnosis not present

## 2020-05-18 HISTORY — PX: COLONOSCOPY WITH PROPOFOL: SHX5780

## 2020-05-18 HISTORY — DX: Presence of spectacles and contact lenses: Z97.3

## 2020-05-18 SURGERY — COLONOSCOPY WITH PROPOFOL
Anesthesia: General | Site: Rectum

## 2020-05-18 MED ORDER — LACTATED RINGERS IV SOLN: INTRAVENOUS | Status: DC

## 2020-05-18 MED ORDER — PROPOFOL 10 MG/ML IV BOLUS
INTRAVENOUS | Status: DC | PRN
  Administered 2020-05-18 (×2): 50 mg via INTRAVENOUS
  Administered 2020-05-18: 100 mg via INTRAVENOUS

## 2020-05-18 MED ORDER — LIDOCAINE HCL (CARDIAC) PF 100 MG/5ML IV SOSY
PREFILLED_SYRINGE | INTRAVENOUS | Status: DC | PRN
  Administered 2020-05-18: 50 mg via INTRAVENOUS

## 2020-05-18 MED ORDER — SODIUM CHLORIDE 0.9 % IV SOLN: INTRAVENOUS | Status: DC

## 2020-05-18 MED ORDER — STERILE WATER FOR IRRIGATION IR SOLN: Status: DC | PRN

## 2020-05-18 SURGICAL SUPPLY — 6 items
GOWN CVR UNV OPN BCK APRN NK (MISCELLANEOUS) ×2 IMPLANT
GOWN ISOL THUMB LOOP REG UNIV (MISCELLANEOUS) ×4
KIT PRC NS LF DISP ENDO (KITS) ×1 IMPLANT
KIT PROCEDURE OLYMPUS (KITS) ×2
MANIFOLD NEPTUNE II (INSTRUMENTS) ×2 IMPLANT
WATER STERILE IRR 250ML POUR (IV SOLUTION) ×2 IMPLANT

## 2020-05-18 NOTE — Transfer of Care (Signed)
Immediate Anesthesia Transfer of Care Note  Patient: Susan Duncan  Procedure(s) Performed: COLONOSCOPY WITH PROPOFOL (N/A Rectum)  Patient Location: PACU  Anesthesia Type: General  Level of Consciousness: awake, alert  and patient cooperative  Airway and Oxygen Therapy: Patient Spontanous Breathing and Patient connected to supplemental oxygen  Post-op Assessment: Post-op Vital signs reviewed, Patient's Cardiovascular Status Stable, Respiratory Function Stable, Patent Airway and No signs of Nausea or vomiting  Post-op Vital Signs: Reviewed and stable  Complications: No complications documented.

## 2020-05-18 NOTE — Anesthesia Preprocedure Evaluation (Addendum)
Anesthesia Evaluation  Patient identified by MRN, date of birth, ID band  Reviewed: Allergy & Precautions, NPO status , Patient's Chart, lab work & pertinent test results  Airway Mallampati: II  TM Distance: >3 FB Neck ROM: Full    Dental no notable dental hx.    Pulmonary neg pulmonary ROS,    Pulmonary exam normal        Cardiovascular negative cardio ROS Normal cardiovascular exam     Neuro/Psych negative neurological ROS  negative psych ROS   GI/Hepatic negative GI ROS, Neg liver ROS, Screening colonscopy   Endo/Other  negative endocrine ROSBMI 36  Renal/GU negative Renal ROS  negative genitourinary   Musculoskeletal negative musculoskeletal ROS (+)   Abdominal (+) + obese,   Peds  Hematology negative hematology ROS (+)   Anesthesia Other Findings   Reproductive/Obstetrics negative OB ROS                             Anesthesia Physical Anesthesia Plan  ASA: II  Anesthesia Plan: General   Post-op Pain Management:    Induction: Intravenous  PONV Risk Score and Plan: 3 and TIVA, Propofol infusion and Treatment may vary due to age or medical condition  Airway Management Planned: Natural Airway  Additional Equipment:   Intra-op Plan:   Post-operative Plan:   Informed Consent: I have reviewed the patients History and Physical, chart, labs and discussed the procedure including the risks, benefits and alternatives for the proposed anesthesia with the patient or authorized representative who has indicated his/her understanding and acceptance.     Dental advisory given  Plan Discussed with: CRNA  Anesthesia Plan Comments:         Anesthesia Quick Evaluation

## 2020-05-18 NOTE — H&P (Signed)
Susan Lame, MD [address] [name], [name] [address] Phone: [phone] Fax : [phone]  Primary Care Physician:  [name], MD Primary Gastroenterologist:  Dr. [name]  Pre-Procedure History & Physical: HPI:  [name] is a 50 y.o. female is here for a screening colonoscopy.   Past Medical History:  Diagnosis Date  . Wears contact lenses    sometimes    Past Surgical History:  Procedure Laterality Date  . ADENOIDECTOMY    . CERVICAL BIOPSY  W/ LOOP ELECTRODE EXCISION    . CESAREAN SECTION    . WISDOM TOOTH EXTRACTION      Prior to Admission medications   Medication Sig Start Date End Date Taking? Authorizing Provider  Ascorbic Acid (VITAMIN C PO) Take by mouth daily.   Yes [provider]  BIOTIN PO Take by mouth daily.   Yes [provider]  Multiple Vitamin (MULTIVITAMIN) capsule Take 1 capsule by mouth daily.   Yes [provider]  Multiple Vitamins-Minerals (ZINC PO) Take by mouth daily.   Yes [provider]  VITAMIN D PO Take by mouth daily.   Yes [provider]  VITAMIN E PO Take by mouth daily.   Yes [provider]    Allergies as of 05/08/2020  . (No Known Allergies)    Family History  Problem Relation Age of Onset  . Cancer Paternal Aunt   . Cancer Paternal Uncle   . Diabetes Father   . Alzheimer's disease Maternal Grandmother   . Cancer Maternal Grandfather        larynx neoplasm, malignant  . Heart disease Paternal Grandfather   . Breast cancer Neg Hx     Social History   Socioeconomic History  . Marital status: Married    Spouse name: Not on file  . Number of children: Not on file  . Years of education: Not on file  . Highest education level: Not on file  Occupational History  . Not on file  Tobacco Use  . Smoking status: Never Smoker  . Smokeless tobacco: Never Used  Vaping Use  . Vaping Use: Never used  Substance and Sexual Activity  . Alcohol  use: No  . Drug use: No  . Sexual activity: Yes  Other Topics Concern  . Not on file  Social History Narrative  . Not on file   Social Determinants of Health   Financial Resource Strain: Not on file  Food Insecurity: Not on file  Transportation Needs: Not on file  Physical Activity: Not on file  Stress: Not on file  Social Connections: Not on file  Intimate Partner Violence: Not on file    Review of Systems: See HPI, otherwise negative ROS  Physical Exam: BP (!) 139/94   Pulse (!) 107   Temp (!) 97.2 F (36.2 C)   Ht 5' (1.524 m)   Wt 84 kg   LMP 09/17/2015   SpO2 94%   BMI 36.17 kg/m  General:   Alert,  pleasant and cooperative in NAD Head:  Normocephalic and atraumatic. Neck:  Supple; no masses or thyromegaly. Lungs:  Clear throughout to auscultation.    Heart:  Regular rate and rhythm. Abdomen:  Soft, nontender and nondistended. Normal bowel sounds, without guarding, and without rebound.   Neurologic:  Alert and  oriented x4;  grossly normal neurologically.  Impression/Plan: Susan Duncan is now here to undergo a screening colonoscopy.  Risks, benefits, and alternatives regarding colonoscopy have been  reviewed with the patient.  Questions have been answered.  All parties agreeable.

## 2020-05-18 NOTE — Op Note (Signed)
[facility] Patient Name: Susan Duncan Procedure Date: 05/18/2020 8:10 AM MRN: [id number] Account #: [id number] Date of Birth: 06/28/1970 Admit Type: Outpatient Age: 50 Room: [facility] OR ROOM 01 Gender: Female Note Status: Finalized Procedure:             Colonoscopy Indications:           Screening for colorectal malignant neoplasm Providers:             [name] MD, MD Referring MD:          [name], MD (Referring MD) Medicines:             Propofol per Anesthesia Complications:         No immediate complications. Procedure:             Pre-Anesthesia Assessment:                        - Prior to the procedure, a History and Physical was                         performed, and patient medications and allergies were                         reviewed. The patient's tolerance of previous                         anesthesia was also reviewed. The risks and benefits                         of the procedure and the sedation options and risks                         were discussed with the patient. All questions were                         answered, and informed consent was obtained. Prior                         Anticoagulants: The patient has taken no previous                         anticoagulant or antiplatelet agents. ASA Grade                         Assessment: II - A patient with mild systemic disease.                         After reviewing the risks and benefits, the patient                         was deemed in satisfactory condition to undergo the                         procedure.                        After obtaining informed consent, the colonoscope was  passed under direct vision. Throughout the procedure,                         the patient's blood pressure, pulse, and oxygen                         saturations were monitored continuously. The                         Colonoscope was introduced through the  anus and                         advanced to the the cecum, identified by appendiceal                         orifice and ileocecal valve. The colonoscopy was                         performed without difficulty. The patient tolerated                         the procedure well. The quality of the bowel                         preparation was excellent. Findings:      The perianal and digital rectal examinations were normal.      Non-bleeding internal hemorrhoids were found during retroflexion. The       hemorrhoids were Grade I (internal hemorrhoids that do not prolapse).      A few small-mouthed diverticula were found in the entire colon. Impression:            - Non-bleeding internal hemorrhoids.                        - Diverticulosis in the entire examined colon.                        - No specimens collected. Recommendation:        - Discharge patient to home.                        - Resume previous diet.                        - Continue present medications.                        - Repeat colonoscopy in 10 years for screening                         purposes. Procedure Code(s):     --- Professional ---                        [phone], Colonoscopy, flexible; diagnostic, including                         collection of specimen(s) by brushing or washing, when                         performed (separate  procedure) Diagnosis Code(s):     --- Professional ---                        Z12.11, Encounter for screening for malignant neoplasm                         of colon CPT copyright 2019 American Medical Association. All rights reserved. The codes documented in this report are preliminary and upon coder review may  be revised to meet current compliance requirements. [name] MD, MD 05/18/2020 8:39:18 AM This report has been signed electronically. Number of Addenda: 0 Note Initiated On: 05/18/2020 8:10 AM Scope Withdrawal Time: 0 hours 7 minutes 45 seconds  Total Procedure Duration: 0  hours 12 minutes 36 seconds  Estimated Blood Loss:  Estimated blood loss: none. Estimated blood loss: none.      [facility]

## 2020-05-18 NOTE — Anesthesia Procedure Notes (Signed)
Procedure Name: General with mask airway Date/Time: 05/18/2020 8:29 AM Performed by: Silvana Newness, CRNA Pre-anesthesia Checklist: Patient identified, Emergency Drugs available, Suction available, Timeout performed and Patient being monitored Patient Re-evaluated:Patient Re-evaluated prior to induction Oxygen Delivery Method: Nasal cannula Preoxygenation: Pre-oxygenation with 100% oxygen

## 2020-05-18 NOTE — Anesthesia Postprocedure Evaluation (Signed)
Anesthesia Post Note  Patient: Susan Duncan  Procedure(s) Performed: COLONOSCOPY WITH PROPOFOL (N/A Rectum)     Patient location during evaluation: PACU Anesthesia Type: General Level of consciousness: awake and alert Pain management: pain level controlled Vital Signs Assessment: post-procedure vital signs reviewed and stable Respiratory status: nonlabored ventilation and spontaneous breathing Cardiovascular status: blood pressure returned to baseline Postop Assessment: no apparent nausea or vomiting Anesthetic complications: no   No complications documented.  Gabrille Kilbride [employer]

## 2020-05-19 ENCOUNTER — Encounter: Payer: Self-pay | Admitting: Gastroenterology

## 2020-11-15 ENCOUNTER — Other Ambulatory Visit: Payer: Self-pay | Admitting: Obstetrics & Gynecology

## 2020-11-15 DIAGNOSIS — Z1231 Encounter for screening mammogram for malignant neoplasm of breast: Secondary | ICD-10-CM

## 2020-12-12 ENCOUNTER — Other Ambulatory Visit (HOSPITAL_COMMUNITY)
Admission: RE | Admit: 2020-12-12 | Discharge: 2020-12-12 | Disposition: A | Discharge status: Home / Self Care | Payer: BC Managed Care – PPO | Source: Ambulatory Visit | Attending: Obstetrics & Gynecology | Admitting: Obstetrics & Gynecology

## 2020-12-12 ENCOUNTER — Ambulatory Visit (INDEPENDENT_AMBULATORY_CARE_PROVIDER_SITE_OTHER): Payer: BC Managed Care – PPO | Admitting: Obstetrics & Gynecology

## 2020-12-12 ENCOUNTER — Other Ambulatory Visit: Payer: Self-pay

## 2020-12-12 ENCOUNTER — Encounter: Payer: Self-pay | Admitting: Obstetrics & Gynecology

## 2020-12-12 VITALS — BP 128/80 | Ht 60.0 in | Wt 204.0 lb

## 2020-12-12 DIAGNOSIS — R7303 Prediabetes: Secondary | ICD-10-CM

## 2020-12-12 DIAGNOSIS — Z01419 Encounter for gynecological examination (general) (routine) without abnormal findings: Secondary | ICD-10-CM

## 2020-12-12 DIAGNOSIS — Z124 Encounter for screening for malignant neoplasm of cervix: Secondary | ICD-10-CM | POA: Insufficient documentation

## 2020-12-12 NOTE — Progress Notes (Signed)
HPI:      [name] is a 50 y.o. [id number] who LMP was in the past, she presents today for her annual examination.  The patient has no complaints today. The patient is sexually active. Herlast pap: approximate date 2019 and was normal and last mammogram: approximate date 2021 and was normal.  The patient does perform self breast exams.  There is no notable family history of breast or ovarian cancer in her family. The patient is not taking hormone replacement therapy. Patient denies post-menopausal vaginal bleeding.   The patient has regular exercise: yes. The patient denies current symptoms of depression.    GYN Hx: Last Colonoscopy:1 year ago. Normal.  Last DEXA:  never  ago.    PMHx: Past Medical History:  Diagnosis Date   Wears contact lenses    sometimes   Past Surgical History:  Procedure Laterality Date   ADENOIDECTOMY     CERVICAL BIOPSY  W/ LOOP ELECTRODE EXCISION     CESAREAN SECTION     COLONOSCOPY WITH PROPOFOL N/A 05/18/2020   Procedure: COLONOSCOPY WITH PROPOFOL;  Surgeon: [name], MD;  Location: [facility];  Service: Endoscopy;  Laterality: N/A;  priority 4   WISDOM TOOTH EXTRACTION     Family History  Problem Relation Age of Onset   Cancer Paternal Aunt    Cancer Paternal Uncle    Diabetes Father    Alzheimer's disease Maternal Grandmother    Cancer Maternal Grandfather        larynx neoplasm, malignant   Heart disease Paternal Grandfather    Breast cancer Neg Hx    Social History   Tobacco Use   Smoking status: Never   Smokeless tobacco: Never  Vaping Use   Vaping Use: Never used  Substance Use Topics   Alcohol use: No   Drug use: No    Current Outpatient Medications:    Ascorbic Acid (VITAMIN C PO), Take by mouth daily., Disp: , Rfl:    BIOTIN PO, Take by mouth daily., Disp: , Rfl:    Multiple Vitamin (MULTIVITAMIN) capsule, Take 1 capsule by mouth daily., Disp: , Rfl:    Multiple Vitamins-Minerals (ZINC PO), Take by mouth daily.,  Disp: , Rfl:    VITAMIN D PO, Take by mouth daily., Disp: , Rfl:    VITAMIN E PO, Take by mouth daily., Disp: , Rfl:  Allergies: Retinal [vitamin a] and Tape  Review of Systems  Constitutional:  Negative for chills, fever and malaise/fatigue.  HENT:  Negative for congestion, sinus pain and sore throat.   Eyes:  Negative for blurred vision and pain.  Respiratory:  Negative for cough and wheezing.   Cardiovascular:  Negative for chest pain and leg swelling.  Gastrointestinal:  Negative for abdominal pain, constipation, diarrhea, heartburn, nausea and vomiting.  Genitourinary:  Negative for dysuria, frequency, hematuria and urgency.  Musculoskeletal:  Negative for back pain, joint pain, myalgias and neck pain.  Skin:  Negative for itching and rash.  Neurological:  Negative for dizziness, tremors and weakness.  Endo/Heme/Allergies:  Does not bruise/bleed easily.  Psychiatric/Behavioral:  Negative for depression. The patient is not nervous/anxious and does not have insomnia.    Objective: BP 128/80   Ht 5' (1.524 m)   Wt 204 lb (92.5 kg)   LMP 09/17/2015   BMI 39.84 kg/m   Filed Weights   12/12/20 1416  Weight: 204 lb (92.5 kg)   Body mass index is 39.84 kg/m. Physical Exam Constitutional:  General: She is not in acute distress.    Appearance: She is well-developed.  Genitourinary:     Bladder, rectum and urethral meatus normal.     No lesions in the vagina.     Right Labia: No rash, tenderness or lesions.    Left Labia: No tenderness, lesions or rash.    No vaginal bleeding.      Right Adnexa: not tender and no mass present.    Left Adnexa: not tender and no mass present.    No cervical motion tenderness, friability, lesion or polyp.     Uterus is not enlarged.     No uterine mass detected.    Pelvic exam was performed with patient in the lithotomy position.  Breasts:    Right: No mass, skin change or tenderness.     Left: No mass, skin change or tenderness.  HENT:      Head: Normocephalic and atraumatic. No laceration.     Right Ear: Hearing normal.     Left Ear: Hearing normal.     Mouth/Throat:     Pharynx: Uvula midline.  Eyes:     Pupils: Pupils are equal, round, and reactive to light.  Neck:     Thyroid: No thyromegaly.  Cardiovascular:     Rate and Rhythm: Normal rate and regular rhythm.     Heart sounds: No murmur heard.   No friction rub. No gallop.  Pulmonary:     Effort: Pulmonary effort is normal. No respiratory distress.     Breath sounds: Normal breath sounds. No wheezing.  Abdominal:     General: Bowel sounds are normal. There is no distension.     Palpations: Abdomen is soft.     Tenderness: There is no abdominal tenderness. There is no rebound.  Musculoskeletal:        General: Normal range of motion.     Cervical back: Normal range of motion and neck supple.  Neurological:     Mental Status: She is alert and oriented to person, place, and time.     Cranial Nerves: No cranial nerve deficit.  Skin:    General: Skin is warm and dry.  Psychiatric:        Judgment: Judgment normal.  Vitals reviewed.    Assessment: Annual Exam 1. Women's annual routine gynecological examination   2. Screening for cervical cancer   3. Prediabetes     Plan:            1.  Cervical Screening-  Pap smear done today  2. Breast screening- Exam annually and mammogram scheduled  3. Colonoscopy every 10 years, Hemoccult testing after age 62  4. Labs Ordered today Prior borderline high HgbA1C Other labs normal last year  5. Counseling for hormonal therapy: none    F/U  Return in about 1 year (around 12/12/2021) for Annual.  Barnett Applebaum, MD, [facility], [facility] 12/12/2020  3:09 PM

## 2020-12-12 NOTE — Patient Instructions (Signed)
PAP every three years Mammogram every year    Call [phone] to schedule at [facility] Colonoscopy every 10 years Labs yearly (with PCP)  Thank you for choosing [facility]. As part of our ongoing efforts to improve patient experience, we would appreciate your feedback. Please fill out the short survey that you will receive by mail or MyChart. Your opinion is important to us! - Dr. Brittain Hosie  Recommendations to boost your immunity to prevent illness such as viral flu and colds, including covid19, are as follows:       - - -  Vitamin K2 and Vitamin D3  - - - Take Vitamin K2 at 200-300 mcg daily (usually 2-3 pills daily of the over the counter formulation). Take Vitamin D3 at 3000-4000 U daily (usually 3-4 pills daily of the over the counter formulation). Studies show that these two at high normal levels in your system are very effective in keeping your immunity so strong and protective that you will be unlikely to contract viral illness such as those listed above.  Dr Makhi Muzquiz  

## 2020-12-13 LAB — HEMOGLOBIN A1C
Est. average glucose Bld gHb Est-mCnc: 128 mg/dL
Hgb A1c MFr Bld: 6.1 % — ABNORMAL HIGH (ref 4.8–5.6)

## 2020-12-15 LAB — CYTOLOGY - PAP
Comment: NEGATIVE
Diagnosis: NEGATIVE
Diagnosis: REACTIVE
High risk HPV: NEGATIVE

## 2020-12-26 ENCOUNTER — Other Ambulatory Visit: Payer: Self-pay

## 2020-12-26 ENCOUNTER — Ambulatory Visit
Admission: RE | Admit: 2020-12-26 | Discharge: 2020-12-26 | Disposition: A | Discharge status: Home / Self Care | Payer: BC Managed Care – PPO | Source: Ambulatory Visit | Attending: Obstetrics & Gynecology | Admitting: Obstetrics & Gynecology

## 2020-12-26 DIAGNOSIS — Z1231 Encounter for screening mammogram for malignant neoplasm of breast: Secondary | ICD-10-CM | POA: Insufficient documentation

## 2021-12-16 NOTE — Progress Notes (Unsigned)
PCP: [name], MD   No chief complaint on file.   HPI:      [name] is a 51 y.o. [id number] whose LMP was Patient's last menstrual period was 09/17/2015., presents today for her annual examination.  Her menses are absent due to menopause, no PMB She {does:18564} have vasomotor sx.   Sex activity: {sex active: 315163}. She {does:18564} have vaginal dryness.  Last Pap: 12/12/20 Results were: no abnormalities /neg HPV DNA.  Hx of STDs: {STD hx:14358}  Last mammogram: 12/26/20  Results were: normal--routine follow-up in 12 months There is no FH of breast cancer. There is no FH of ovarian cancer. The patient {does:18564} do self-breast exams.  Colonoscopy: 4/22 with Dr. [name],  Repeat due after 10*** years.   Tobacco use: {tob:20664} Alcohol use: {Alcohol:11675} No drug use Exercise: {exercise:31265}  She {does:18564} get adequate calcium and Vitamin D in her diet.  Labs with PCP.   Patient Active Problem List   Diagnosis Date Noted   Encounter for screening colonoscopy     Past Surgical History:  Procedure Laterality Date   ADENOIDECTOMY     CERVICAL BIOPSY  W/ LOOP ELECTRODE EXCISION     CESAREAN SECTION     COLONOSCOPY WITH PROPOFOL N/A 05/18/2020   Procedure: COLONOSCOPY WITH PROPOFOL;  Surgeon: [name], MD;  Location: [facility];  Service: Endoscopy;  Laterality: N/A;  priority 4   WISDOM TOOTH EXTRACTION      Family History  Problem Relation Age of Onset   Cancer Paternal Aunt    Cancer Paternal Uncle    Diabetes Father    Alzheimer's disease Maternal Grandmother    Cancer Maternal Grandfather        larynx neoplasm, malignant   Heart disease Paternal Grandfather    Breast cancer Neg Hx     Social History   Socioeconomic History   Marital status: Married    Spouse name: Not on file   Number of children: Not on file   Years of education: Not on file   Highest education level: Not on file  Occupational History   Not on file   Tobacco Use   Smoking status: Never   Smokeless tobacco: Never  Vaping Use   Vaping Use: Never used  Substance and Sexual Activity   Alcohol use: No   Drug use: No   Sexual activity: Yes  Other Topics Concern   Not on file  Social History Narrative   Not on file   Social Determinants of Health   Financial Resource Strain: Not on file  Food Insecurity: Not on file  Transportation Needs: Not on file  Physical Activity: Not on file  Stress: Not on file  Social Connections: Not on file  Intimate Partner Violence: Not on file     Current Outpatient Medications:    Ascorbic Acid (VITAMIN C PO), Take by mouth daily., Disp: , Rfl:    BIOTIN PO, Take by mouth daily., Disp: , Rfl:    Multiple Vitamin (MULTIVITAMIN) capsule, Take 1 capsule by mouth daily., Disp: , Rfl:    Multiple Vitamins-Minerals (ZINC PO), Take by mouth daily., Disp: , Rfl:    VITAMIN D PO, Take by mouth daily., Disp: , Rfl:    VITAMIN E PO, Take by mouth daily., Disp: , Rfl:      ROS:  Review of Systems BREAST: No symptoms    Objective: LMP 09/17/2015    OBGyn Exam  Results: No results found for  this or any previous visit (from the past 24 hour(s)).  Assessment/Plan:  No diagnosis found.   No orders of the defined types were placed in this encounter.           GYN counsel {counseling: 16159}    F/U  No follow-ups on file.  Fain Francis B. Elica Almas, PA-C 12/16/2021 8:11 PM

## 2021-12-17 ENCOUNTER — Ambulatory Visit (INDEPENDENT_AMBULATORY_CARE_PROVIDER_SITE_OTHER): Payer: BC Managed Care – PPO | Admitting: Obstetrics and Gynecology

## 2021-12-17 ENCOUNTER — Encounter: Payer: Self-pay | Admitting: Obstetrics and Gynecology

## 2021-12-17 VITALS — BP 120/60 | Ht 60.0 in | Wt 215.0 lb

## 2021-12-17 DIAGNOSIS — Z0000 Encounter for general adult medical examination without abnormal findings: Secondary | ICD-10-CM

## 2021-12-17 DIAGNOSIS — Z13220 Encounter for screening for lipoid disorders: Secondary | ICD-10-CM

## 2021-12-17 DIAGNOSIS — Z01419 Encounter for gynecological examination (general) (routine) without abnormal findings: Secondary | ICD-10-CM | POA: Diagnosis not present

## 2021-12-17 DIAGNOSIS — Z131 Encounter for screening for diabetes mellitus: Secondary | ICD-10-CM

## 2021-12-17 DIAGNOSIS — R7303 Prediabetes: Secondary | ICD-10-CM | POA: Diagnosis not present

## 2021-12-17 DIAGNOSIS — Z1211 Encounter for screening for malignant neoplasm of colon: Secondary | ICD-10-CM

## 2021-12-17 DIAGNOSIS — Z1231 Encounter for screening mammogram for malignant neoplasm of breast: Secondary | ICD-10-CM

## 2021-12-17 NOTE — Patient Instructions (Signed)
I value your feedback and you entrusting us with your care. If you get a [facility] patient survey, I would appreciate you taking the time to let us know about your experience today. Thank you!  [facility]: [phone]      

## 2021-12-19 LAB — COMPREHENSIVE METABOLIC PANEL
ALT: 24 IU/L (ref 0–32)
AST: 27 IU/L (ref 0–40)
Albumin/Globulin Ratio: 1.5 (ref 1.2–2.2)
Albumin: 4.4 g/dL (ref 3.8–4.9)
Alkaline Phosphatase: 110 IU/L (ref 44–121)
BUN/Creatinine Ratio: 13 (ref 9–23)
BUN: 12 mg/dL (ref 6–24)
Bilirubin Total: 0.8 mg/dL (ref 0.0–1.2)
CO2: 18 mmol/L — ABNORMAL LOW (ref 20–29)
Calcium: 9.8 mg/dL (ref 8.7–10.2)
Chloride: 103 mmol/L (ref 96–106)
Creatinine, Ser: 0.9 mg/dL (ref 0.57–1.00)
Globulin, Total: 2.9 g/dL (ref 1.5–4.5)
Glucose: 93 mg/dL (ref 70–99)
Potassium: 4 mmol/L (ref 3.5–5.2)
Sodium: 137 mmol/L (ref 134–144)
Total Protein: 7.3 g/dL (ref 6.0–8.5)
eGFR: 76 mL/min/{1.73_m2} (ref >59)

## 2021-12-19 LAB — HEMOGLOBIN A1C
Est. average glucose Bld gHb Est-mCnc: 131 mg/dL
Hgb A1c MFr Bld: 6.2 % — ABNORMAL HIGH (ref 4.8–5.6)

## 2021-12-19 NOTE — Addendum Note (Signed)
Addended by: Ardeth Perfect B on: 00/08/6224 10:21 AM   Modules accepted: Orders

## 2021-12-27 ENCOUNTER — Ambulatory Visit
Admission: RE | Admit: 2021-12-27 | Discharge: 2021-12-27 | Disposition: A | Discharge status: Home / Self Care | Payer: BC Managed Care – PPO | Source: Ambulatory Visit | Attending: Obstetrics and Gynecology | Admitting: Obstetrics and Gynecology

## 2021-12-27 DIAGNOSIS — Z1231 Encounter for screening mammogram for malignant neoplasm of breast: Secondary | ICD-10-CM | POA: Insufficient documentation

## 2021-12-29 IMAGING — MG MM DIGITAL SCREENING BILAT W/ TOMO AND CAD
8 series · 8 of 24 positions shown · non-contrast
Comparison: Previous exam(s).

CLINICAL DATA: Screening.

EXAM:
DIGITAL SCREENING BILATERAL MAMMOGRAM WITH TOMOSYNTHESIS AND CAD
TECHNIQUE: Bilateral screening digital craniocaudal and mediolateral oblique
mammograms were obtained. Bilateral screening digital breast
tomosynthesis was performed. The images were evaluated with
computer-aided detection.

[L CC synth-2D]
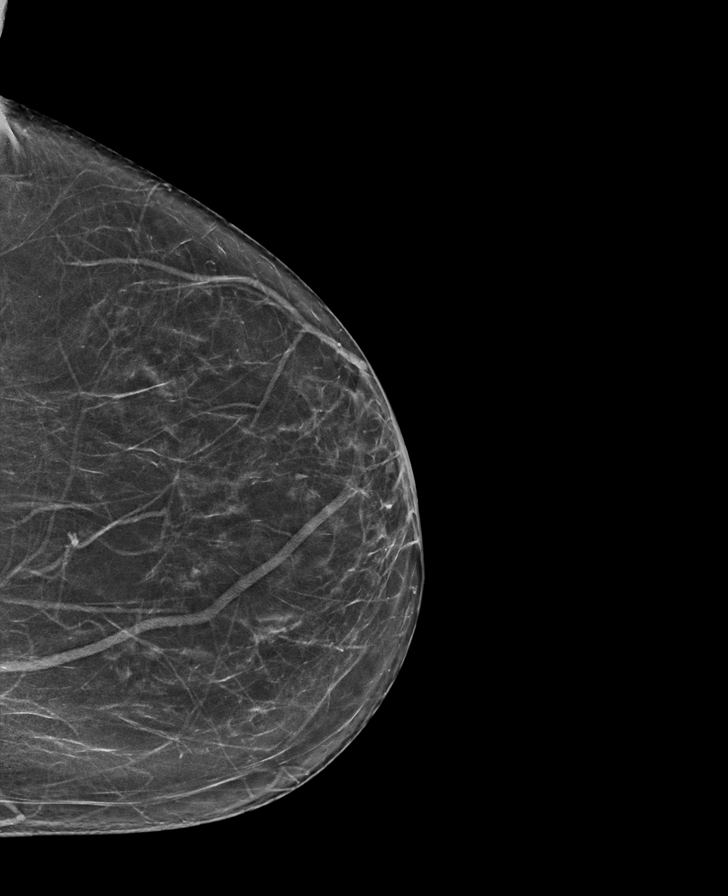

[L MLO synth-2D]
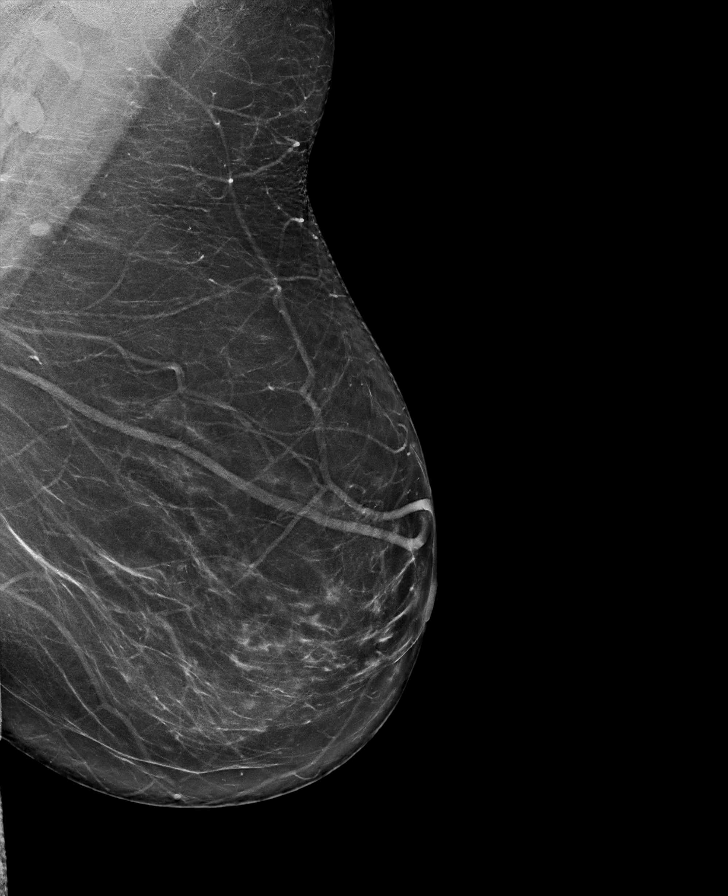

[R MLO synth-2D]
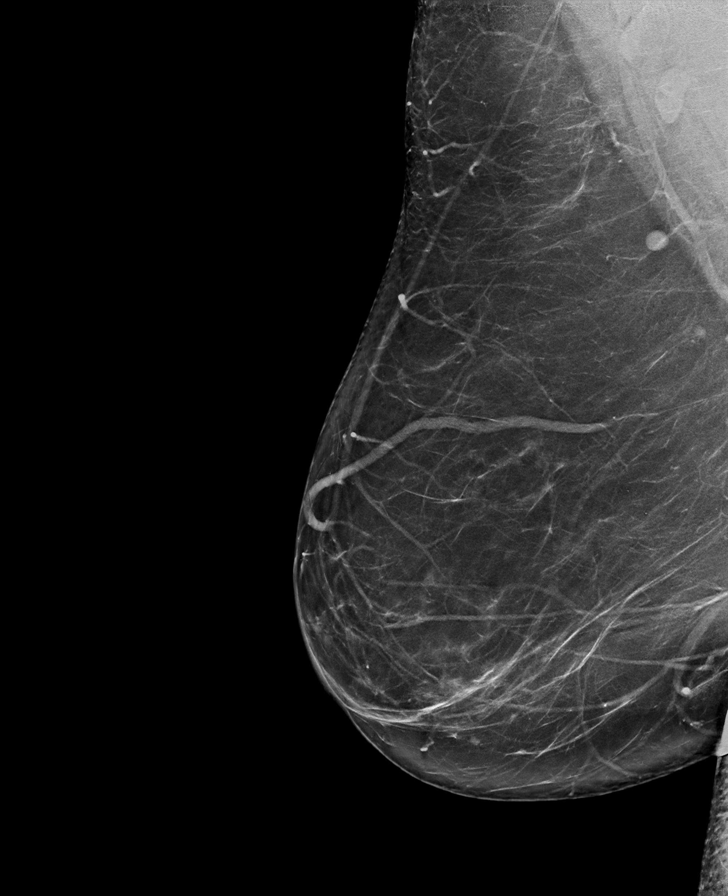

[R CC synth-2D]
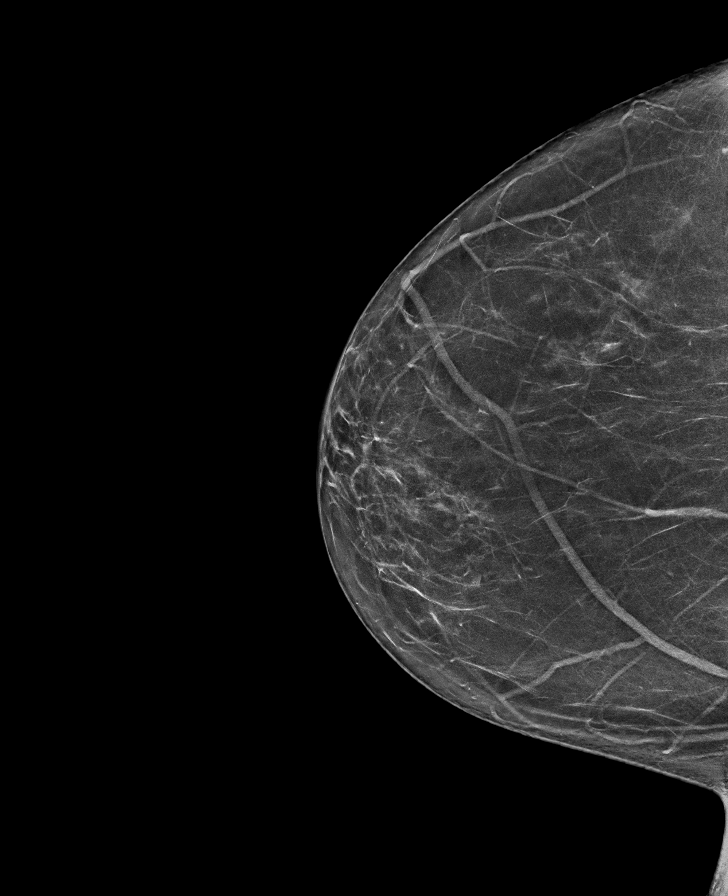

[L CC tomo · tomo slice 33/65.0]
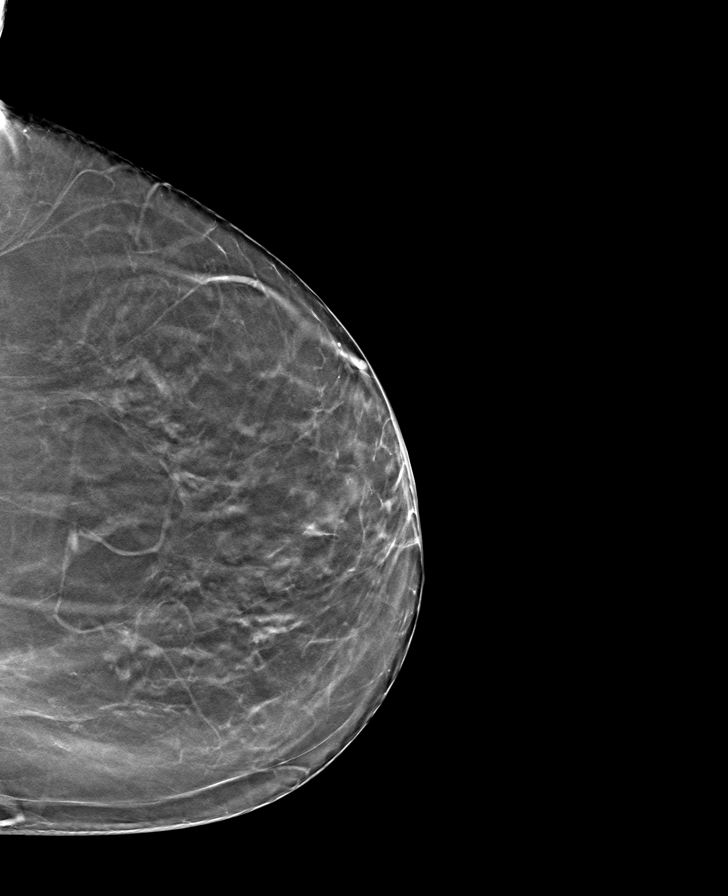

[R MLO tomo · tomo slice 41/80.0]
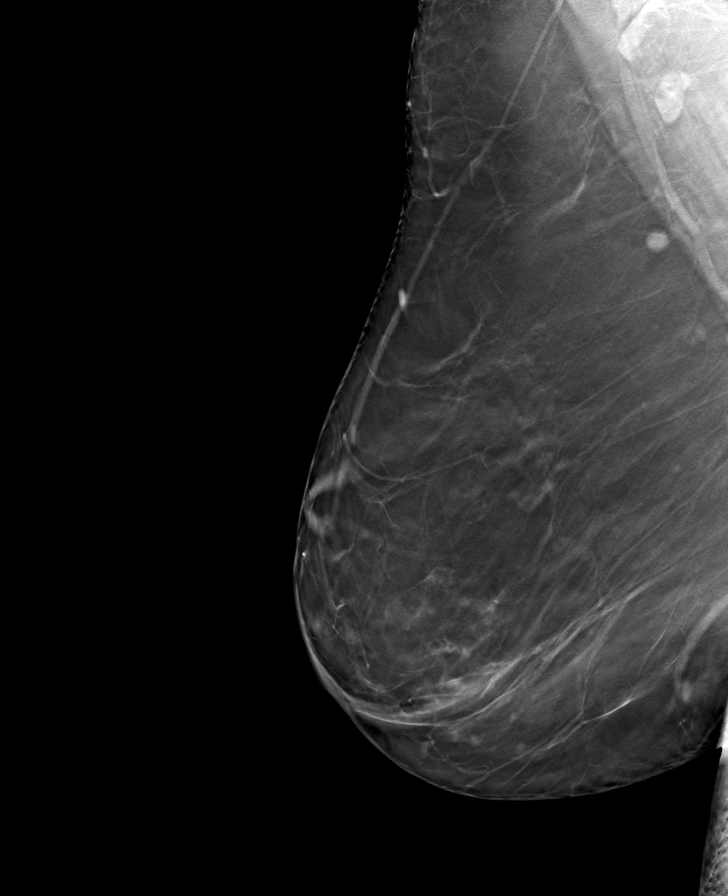

[L MLO tomo · tomo slice 39/78.0]
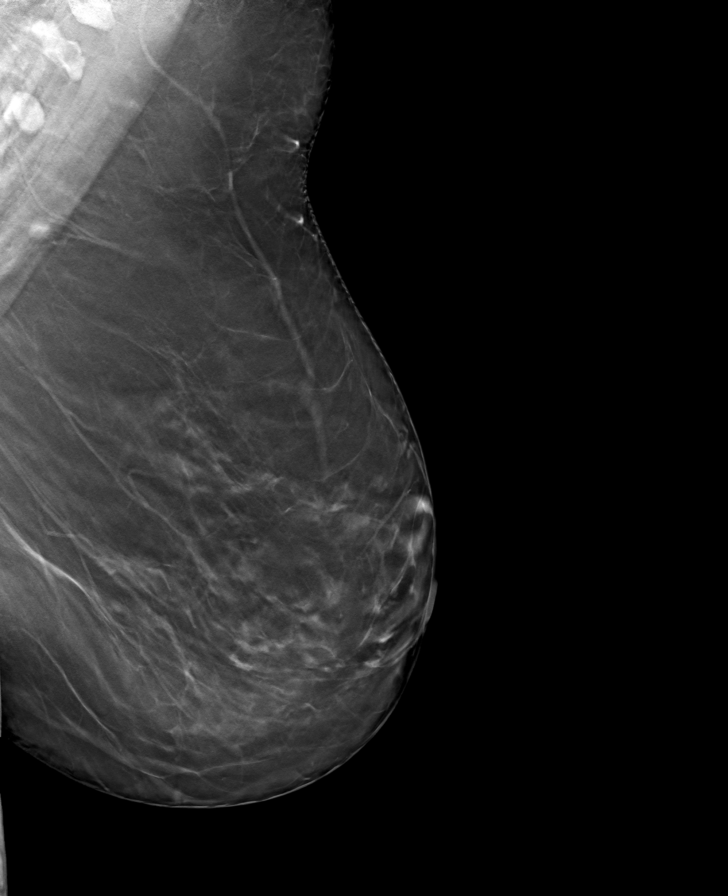

[R CC tomo · tomo slice 35/68.0]
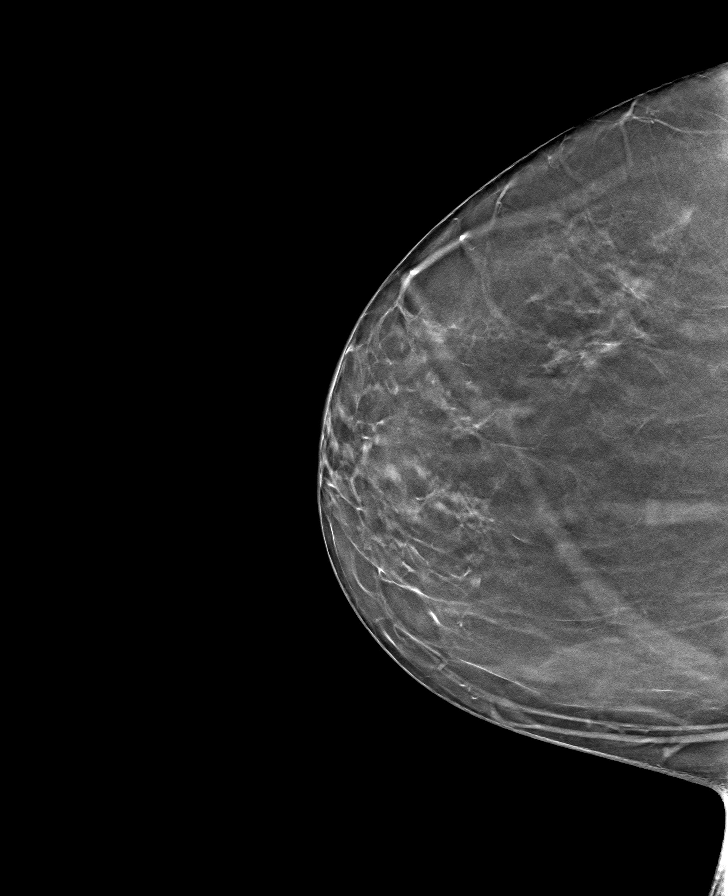

[8 of 24 positions shown; findings below may reference images not displayed]

ACR Breast Density Category b: There are scattered areas of
fibroglandular density.
FINDINGS: There are no findings suspicious for malignancy.
IMPRESSION: No mammographic evidence of malignancy. A result letter of this
screening mammogram will be mailed directly to the patient.

RECOMMENDATION:
Screening mammogram in one year. (Code:51-O-LD2)

BI-RADS CATEGORY  1: Negative.

## 2022-12-17 ENCOUNTER — Telehealth: Payer: Self-pay

## 2022-12-17 DIAGNOSIS — Z1231 Encounter for screening mammogram for malignant neoplasm of breast: Secondary | ICD-10-CM

## 2022-12-17 NOTE — Telephone Encounter (Signed)
Pt calling; has recv'd a letter from [place] stating it is time for her mammogram.  Wants to know if an order can be put in since she has her annual scheduled.  Adv shouldn't be a problem.

## 2022-12-17 NOTE — Telephone Encounter (Signed)
Order placed, pt can call [facility] to schedule

## 2022-12-18 NOTE — Telephone Encounter (Signed)
Pt aware.

## 2022-12-22 NOTE — Progress Notes (Unsigned)
PCP: Rica Records, PA-C   No chief complaint on file.   HPI:      [name] is a 52 y.o. [id number] whose LMP was Patient's last menstrual period was 09/17/2015., presents today for her annual examination.  Her menses are absent due to menopause, no PMB; gets occas vasomotor sx.   Sex activity: single partner, contraception - post menopausal status. She does not have vaginal dryness.  Last Pap: 12/12/20 Results were: no abnormalities /neg HPV DNA.  Hx of STDs: HPV; hx of LEEP in 1998; normal paps since  Last mammogram: 12/27/21 Results were: normal--routine follow-up in 12 months There is no FH of breast cancer. There is no FH of ovarian cancer. The patient does do self-breast exams.  Colonoscopy: 4/22 with Dr. Servando Snare,  Repeat due after 5 years.   Tobacco use: The patient denies current or previous tobacco use. Alcohol use: none No drug use Exercise: moderately active  She does get adequate calcium and Vitamin D in her diet.  Hx of pre-DM 10/22 with slightly elevated lipids 11/21; due for labs today but not fasting.  Pt interested in losing wt but is a "picky" eater.    Patient Active Problem List   Diagnosis Date Noted   Encounter for screening colonoscopy     Past Surgical History:  Procedure Laterality Date   ADENOIDECTOMY     CERVICAL BIOPSY  W/ LOOP ELECTRODE EXCISION     CESAREAN SECTION     COLONOSCOPY WITH PROPOFOL N/A 05/18/2020   Procedure: COLONOSCOPY WITH PROPOFOL;  Surgeon: Midge Minium, MD;  Location: [facility];  Service: Endoscopy;  Laterality: N/A;  priority 4   WISDOM TOOTH EXTRACTION      Family History  Problem Relation Age of Onset   Diabetes Father    Cancer Paternal Aunt        not sure type of cancer   Cancer - Other Paternal Uncle        Spinal   Alzheimer's disease Maternal Grandmother    Cancer Maternal Grandfather        larynx neoplasm, malignant   Heart disease Paternal Grandfather    Breast cancer Neg Hx      Social History   Socioeconomic History   Marital status: Married    Spouse name: Not on file   Number of children: Not on file   Years of education: Not on file   Highest education level: Not on file  Occupational History   Not on file  Tobacco Use   Smoking status: Never   Smokeless tobacco: Never  Vaping Use   Vaping status: Never Used  Substance and Sexual Activity   Alcohol use: No   Drug use: No   Sexual activity: Yes  Other Topics Concern   Not on file  Social History Narrative   Not on file   Social Determinants of Health   Financial Resource Strain: Not on file  Food Insecurity: Not on file  Transportation Needs: Not on file  Physical Activity: Not on file  Stress: Not on file  Social Connections: Not on file  Intimate Partner Violence: Not on file     Current Outpatient Medications:    Ascorbic Acid (VITAMIN C PO), Take by mouth daily., Disp: , Rfl:    BIOTIN PO, Take by mouth daily., Disp: , Rfl:    Multiple Vitamin (MULTIVITAMIN) capsule, Take 1 capsule by mouth daily., Disp: , Rfl:    Multiple Vitamins-Minerals (ZINC PO),  Take by mouth daily., Disp: , Rfl:    VITAMIN D PO, Take by mouth daily. Vitamin D plus K, Disp: , Rfl:    VITAMIN E PO, Take by mouth daily., Disp: , Rfl:      ROS:  Review of Systems  Constitutional:  Negative for fatigue, fever and unexpected weight change.  Respiratory:  Negative for cough, shortness of breath and wheezing.   Cardiovascular:  Negative for chest pain, palpitations and leg swelling.  Gastrointestinal:  Negative for blood in stool, constipation, diarrhea, nausea and vomiting.  Endocrine: Negative for cold intolerance, heat intolerance and polyuria.  Genitourinary:  Negative for dyspareunia, dysuria, flank pain, frequency, genital sores, hematuria, menstrual problem, pelvic pain, urgency, vaginal bleeding, vaginal discharge and vaginal pain.  Musculoskeletal:  Positive for arthralgias. Negative for back pain,  joint swelling and myalgias.  Skin:  Negative for rash.  Neurological:  Negative for dizziness, syncope, light-headedness, numbness and headaches.  Hematological:  Negative for adenopathy.  Psychiatric/Behavioral:  Negative for agitation, confusion, sleep disturbance and suicidal ideas. The patient is not nervous/anxious.    BREAST: No symptoms    Objective: LMP 09/17/2015    Physical Exam Constitutional:      Appearance: She is well-developed.  Genitourinary:     Vulva normal.     Right Labia: No rash, tenderness or lesions.    Left Labia: No tenderness, lesions or rash.    No vaginal discharge, erythema or tenderness.      Right Adnexa: not tender and no mass present.    Left Adnexa: not tender and no mass present.    No cervical friability or polyp.     Uterus is not enlarged or tender.  Breasts:    Right: No mass, nipple discharge, skin change or tenderness.     Left: No mass, nipple discharge, skin change or tenderness.  Neck:     Thyroid: No thyromegaly.  Cardiovascular:     Rate and Rhythm: Normal rate and regular rhythm.     Heart sounds: Normal heart sounds. No murmur heard. Pulmonary:     Effort: Pulmonary effort is normal.     Breath sounds: Normal breath sounds.  Abdominal:     Palpations: Abdomen is soft.     Tenderness: There is no abdominal tenderness. There is no guarding or rebound.  Musculoskeletal:        General: Normal range of motion.     Cervical back: Normal range of motion.  Lymphadenopathy:     Cervical: No cervical adenopathy.  Neurological:     General: No focal deficit present.     Mental Status: She is alert and oriented to person, place, and time.     Cranial Nerves: No cranial nerve deficit.  Skin:    General: Skin is warm and dry.  Psychiatric:        Mood and Affect: Mood normal.        Behavior: Behavior normal.        Thought Content: Thought content normal.        Judgment: Judgment normal.  Vitals reviewed.     Asessment/Plan:  Encounter for annual routine gynecological examination  Encounter for screening mammogram for malignant neoplasm of breast - Plan: MM 3D SCREEN BREAST BILATERAL; pt to schedule mammo  Blood tests for routine general physical examination - Plan: Comprehensive metabolic panel, Hemoglobin A1c  Screening for diabetes mellitus - Plan: Comprehensive metabolic panel, Hemoglobin A1c; check labs, will f/u with results.   Pre-diabetes - Plan:  Comprehensive metabolic panel, Hemoglobin A1c          GYN counsel breast self exam, mammography screening, menopause, adequate intake of calcium and vitamin D, diet and exercise    F/U  No follow-ups on file.  Osiel Stick B. Kamika Goodloe, PA-C 12/22/2022 7:38 PM

## 2022-12-23 ENCOUNTER — Encounter: Payer: Self-pay | Admitting: Obstetrics and Gynecology

## 2022-12-23 ENCOUNTER — Ambulatory Visit (INDEPENDENT_AMBULATORY_CARE_PROVIDER_SITE_OTHER): Payer: No Typology Code available for payment source | Admitting: Obstetrics and Gynecology

## 2022-12-23 VITALS — BP 118/80 | HR 99 | Ht 60.0 in | Wt 213.0 lb

## 2022-12-23 DIAGNOSIS — R7303 Prediabetes: Secondary | ICD-10-CM

## 2022-12-23 DIAGNOSIS — Z0000 Encounter for general adult medical examination without abnormal findings: Secondary | ICD-10-CM

## 2022-12-23 DIAGNOSIS — Z01419 Encounter for gynecological examination (general) (routine) without abnormal findings: Secondary | ICD-10-CM | POA: Diagnosis not present

## 2022-12-23 DIAGNOSIS — Z13220 Encounter for screening for lipoid disorders: Secondary | ICD-10-CM

## 2022-12-23 DIAGNOSIS — Z1231 Encounter for screening mammogram for malignant neoplasm of breast: Secondary | ICD-10-CM

## 2022-12-23 NOTE — Patient Instructions (Addendum)
I value your feedback and you entrusting us with your care. If you get a [facility] patient survey, I would appreciate you taking the time to let us know about your experience today. Thank you! ? ? ?

## 2022-12-24 ENCOUNTER — Other Ambulatory Visit: Payer: No Typology Code available for payment source

## 2022-12-24 DIAGNOSIS — Z13220 Encounter for screening for lipoid disorders: Secondary | ICD-10-CM

## 2022-12-24 DIAGNOSIS — R7303 Prediabetes: Secondary | ICD-10-CM

## 2022-12-24 DIAGNOSIS — Z0000 Encounter for general adult medical examination without abnormal findings: Secondary | ICD-10-CM

## 2022-12-24 DIAGNOSIS — E785 Hyperlipidemia, unspecified: Secondary | ICD-10-CM

## 2022-12-25 LAB — HEMOGLOBIN A1C
Est. average glucose Bld gHb Est-mCnc: 137 mg/dL
Hgb A1c MFr Bld: 6.4 % — ABNORMAL HIGH (ref 4.8–5.6)

## 2022-12-25 LAB — COMPREHENSIVE METABOLIC PANEL
ALT: 20 [IU]/L (ref 0–32)
AST: 20 [IU]/L (ref 0–40)
Albumin: 3.9 g/dL (ref 3.8–4.9)
Alkaline Phosphatase: 102 [IU]/L (ref 44–121)
BUN/Creatinine Ratio: 15 (ref 9–23)
BUN: 13 mg/dL (ref 6–24)
Bilirubin Total: 0.6 mg/dL (ref 0.0–1.2)
CO2: 20 mmol/L (ref 20–29)
Calcium: 9.7 mg/dL (ref 8.7–10.2)
Chloride: 107 mmol/L — ABNORMAL HIGH (ref 96–106)
Creatinine, Ser: 0.84 mg/dL (ref 0.57–1.00)
Globulin, Total: 2.6 g/dL (ref 1.5–4.5)
Glucose: 109 mg/dL — ABNORMAL HIGH (ref 70–99)
Potassium: 4.2 mmol/L (ref 3.5–5.2)
Sodium: 143 mmol/L (ref 134–144)
Total Protein: 6.5 g/dL (ref 6.0–8.5)
eGFR: 83 mL/min/{1.73_m2} (ref >59)

## 2022-12-25 LAB — CBC WITH DIFFERENTIAL/PLATELET
Basophils Absolute: 0.1 10*3/uL (ref 0.0–0.2)
Basos: 1 %
EOS (ABSOLUTE): 0.2 10*3/uL (ref 0.0–0.4)
Eos: 2 %
Hematocrit: 42.6 % (ref 34.0–46.6)
Hemoglobin: 14 g/dL (ref 11.1–15.9)
Immature Grans (Abs): 0 10*3/uL (ref 0.0–0.1)
Immature Granulocytes: 0 %
Lymphocytes Absolute: 3.1 10*3/uL (ref 0.7–3.1)
Lymphs: 40 %
MCH: 29.4 pg (ref 26.6–33.0)
MCHC: 32.9 g/dL (ref 31.5–35.7)
MCV: 89 fL (ref 79–97)
Monocytes Absolute: 0.6 10*3/uL (ref 0.1–0.9)
Monocytes: 8 %
Neutrophils Absolute: 3.8 10*3/uL (ref 1.4–7.0)
Neutrophils: 49 %
Platelets: 272 10*3/uL (ref 150–450)
RBC: 4.77 x10E6/uL (ref 3.77–5.28)
RDW: 12.2 % (ref 11.7–15.4)
WBC: 7.9 10*3/uL (ref 3.4–10.8)

## 2022-12-25 LAB — LIPID PANEL
Chol/HDL Ratio: 4.3 ratio (ref 0.0–4.4)
Cholesterol, Total: 221 mg/dL — ABNORMAL HIGH (ref 100–199)
HDL: 52 mg/dL (ref >39)
LDL Chol Calc (NIH): 134 mg/dL — ABNORMAL HIGH (ref 0–99)
Triglycerides: 195 mg/dL — ABNORMAL HIGH (ref 0–149)
VLDL Cholesterol Cal: 35 mg/dL (ref 5–40)

## 2023-01-06 ENCOUNTER — Encounter: Payer: Self-pay | Admitting: Internal Medicine

## 2023-01-07 ENCOUNTER — Ambulatory Visit
Admission: RE | Admit: 2023-01-07 | Discharge: 2023-01-07 | Disposition: A | Discharge status: Home / Self Care | Payer: No Typology Code available for payment source | Source: Ambulatory Visit | Attending: Obstetrics and Gynecology | Admitting: Obstetrics and Gynecology

## 2023-01-07 DIAGNOSIS — Z1231 Encounter for screening mammogram for malignant neoplasm of breast: Secondary | ICD-10-CM | POA: Insufficient documentation

## 2023-09-01 ENCOUNTER — Other Ambulatory Visit: Payer: Self-pay | Admitting: Obstetrics and Gynecology

## 2023-09-01 DIAGNOSIS — Z1231 Encounter for screening mammogram for malignant neoplasm of breast: Secondary | ICD-10-CM

## 2023-12-28 NOTE — Progress Notes (Unsigned)
 PCP: Watt Bernarda NOVAK, PA-C   No chief complaint on file.   HPI:      [name] is a 53 y.o. [id number] whose LMP was Patient's last menstrual period was 09/17/2015., presents today for her annual examination.  Her menses are absent due to menopause, no PMB; gets occas vasomotor sx.   Sex activity: not sexually active, contraception - post menopausal status. She does not have vaginal dryness/sx.  Last Pap: 12/12/20 Results were: no abnormalities /neg HPV DNA.  Hx of STDs: HPV; hx of LEEP in 1998; normal paps since  Last mammogram: 01/07/23 Results were: normal--routine follow-up in 12 months; has appt 12/25 There is no FH of breast cancer. There is no FH of ovarian cancer. The patient does do self-breast exams.  Colonoscopy: 4/22 with Dr. Jinny,  Repeat due after 5 years.   Tobacco use: The patient denies current or previous tobacco use. Alcohol use: none No drug use Exercise: moderately active  She does get adequate calcium and Vitamin D  in her diet.  Hx of pre-DM 11/23 with slightly elevated lipids 11/21; due for labs today but not fasting. **** Pt trying to lose wt with being more active, eating some vegetables and fruit  Patient Active Problem List   Diagnosis Date Noted   Encounter for screening colonoscopy     Past Surgical History:  Procedure Laterality Date   ADENOIDECTOMY     CERVICAL BIOPSY  W/ LOOP ELECTRODE EXCISION     CESAREAN SECTION     COLONOSCOPY WITH PROPOFOL  N/A 05/18/2020   Procedure: COLONOSCOPY WITH PROPOFOL ;  Surgeon: Jinny Carmine, MD;  Location: [facility];  Service: Endoscopy;  Laterality: N/A;  priority 4   WISDOM TOOTH EXTRACTION      Family History  Problem Relation Age of Onset   Diabetes Father    Cancer Paternal Aunt        not sure type of cancer   Cancer - Other Paternal Uncle        Spinal   Alzheimer's disease Maternal Grandmother    Cancer Maternal Grandfather        larynx neoplasm, malignant   Heart disease  Paternal Grandfather    Breast cancer Neg Hx     Social History   Socioeconomic History   Marital status: Married    Spouse name: Not on file   Number of children: Not on file   Years of education: Not on file   Highest education level: Not on file  Occupational History   Not on file  Tobacco Use   Smoking status: Never   Smokeless tobacco: Never  Vaping Use   Vaping status: Never Used  Substance and Sexual Activity   Alcohol use: No   Drug use: No   Sexual activity: Not Currently  Other Topics Concern   Not on file  Social History Narrative   Not on file   Social Drivers of Health   Financial Resource Strain: Not on file  Food Insecurity: Not on file  Transportation Needs: Not on file  Physical Activity: Not on file  Stress: Not on file  Social Connections: Not on file  Intimate Partner Violence: Not on file     Current Outpatient Medications:    Ascorbic Acid (VITAMIN C PO), Take by mouth daily., Disp: , Rfl:    BIOTIN PO, Take by mouth daily., Disp: , Rfl:    Multiple Vitamins-Minerals (ZINC PO), Take by mouth., Disp: , Rfl:  VITAMIN D  PO, Take by mouth daily. Vitamin D  plus K, Disp: , Rfl:    VITAMIN E PO, Take by mouth daily., Disp: , Rfl:    VITAMIN K PO, Take by mouth., Disp: , Rfl:      ROS:  Review of Systems  Constitutional:  Negative for fatigue, fever and unexpected weight change.  Respiratory:  Negative for cough, shortness of breath and wheezing.   Cardiovascular:  Negative for chest pain, palpitations and leg swelling.  Gastrointestinal:  Negative for blood in stool, constipation, diarrhea, nausea and vomiting.  Endocrine: Negative for cold intolerance, heat intolerance and polyuria.  Genitourinary:  Negative for dyspareunia, dysuria, flank pain, frequency, genital sores, hematuria, menstrual problem, pelvic pain, urgency, vaginal bleeding, vaginal discharge and vaginal pain.  Musculoskeletal:  Negative for arthralgias, back pain, joint  swelling and myalgias.  Skin:  Negative for rash.  Neurological:  Negative for dizziness, syncope, light-headedness, numbness and headaches.  Hematological:  Negative for adenopathy.  Psychiatric/Behavioral:  Negative for agitation, confusion, sleep disturbance and suicidal ideas. The patient is not nervous/anxious.    BREAST: No symptoms    Objective: LMP 09/17/2015    Physical Exam Constitutional:      Appearance: She is well-developed.  Genitourinary:     Vulva normal.     Right Labia: No rash, tenderness or lesions.    Left Labia: No tenderness, lesions or rash.    No vaginal discharge, erythema or tenderness.     Mild vaginal atrophy present.     Right Adnexa: not tender and no mass present.    Left Adnexa: not tender and no mass present.    No cervical friability or polyp.     Uterus is not enlarged or tender.  Breasts:    Right: No mass, nipple discharge, skin change or tenderness.     Left: No mass, nipple discharge, skin change or tenderness.  Neck:     Thyroid : No thyromegaly.  Cardiovascular:     Rate and Rhythm: Normal rate and regular rhythm.     Heart sounds: Normal heart sounds. No murmur heard. Pulmonary:     Effort: Pulmonary effort is normal.     Breath sounds: Normal breath sounds.  Abdominal:     Palpations: Abdomen is soft.     Tenderness: There is no abdominal tenderness. There is no guarding or rebound.  Musculoskeletal:        General: Normal range of motion.     Cervical back: Normal range of motion.  Lymphadenopathy:     Cervical: No cervical adenopathy.  Neurological:     General: No focal deficit present.     Mental Status: She is alert and oriented to person, place, and time.     Cranial Nerves: No cranial nerve deficit.  Skin:    General: Skin is warm and dry.  Psychiatric:        Mood and Affect: Mood normal.        Behavior: Behavior normal.        Thought Content: Thought content normal.        Judgment: Judgment normal.   Vitals reviewed.    Asessment/Plan: Encounter for annual routine gynecological examination  Encounter for screening mammogram for malignant neoplasm of breast; pt has mammo appt  Blood tests for routine general physical examination - Plan: Comprehensive metabolic panel, CBC with Differential/Platelet, Hemoglobin A1c, Lipid panel  Screening cholesterol level - Plan: Lipid panel  Pre-diabetes - Plan: Hemoglobin A1c  GYN counsel breast self exam, mammography screening, menopause, adequate intake of calcium and vitamin D , diet and exercise    F/U  No follow-ups on file.  Trinaty Bundrick B. Naphtali Zywicki, PA-C 12/28/2023 5:24 PM

## 2023-12-29 ENCOUNTER — Ambulatory Visit (INDEPENDENT_AMBULATORY_CARE_PROVIDER_SITE_OTHER): Admitting: Obstetrics and Gynecology

## 2023-12-29 ENCOUNTER — Encounter: Payer: Self-pay | Admitting: Obstetrics and Gynecology

## 2023-12-29 ENCOUNTER — Other Ambulatory Visit (HOSPITAL_COMMUNITY)
Admission: RE | Admit: 2023-12-29 | Discharge: 2023-12-29 | Disposition: A | Discharge status: Home / Self Care | Source: Ambulatory Visit | Attending: Obstetrics and Gynecology | Admitting: Obstetrics and Gynecology

## 2023-12-29 VITALS — BP 127/81 | HR 69 | Ht 60.0 in | Wt 211.0 lb

## 2023-12-29 DIAGNOSIS — Z9889 Other specified postprocedural states: Secondary | ICD-10-CM | POA: Diagnosis present

## 2023-12-29 DIAGNOSIS — Z0000 Encounter for general adult medical examination without abnormal findings: Secondary | ICD-10-CM

## 2023-12-29 DIAGNOSIS — Z124 Encounter for screening for malignant neoplasm of cervix: Secondary | ICD-10-CM | POA: Diagnosis present

## 2023-12-29 DIAGNOSIS — R7303 Prediabetes: Secondary | ICD-10-CM

## 2023-12-29 DIAGNOSIS — Z1151 Encounter for screening for human papillomavirus (HPV): Secondary | ICD-10-CM | POA: Insufficient documentation

## 2023-12-29 DIAGNOSIS — Z01419 Encounter for gynecological examination (general) (routine) without abnormal findings: Secondary | ICD-10-CM

## 2023-12-29 DIAGNOSIS — Z13220 Encounter for screening for lipoid disorders: Secondary | ICD-10-CM

## 2023-12-29 DIAGNOSIS — Z1231 Encounter for screening mammogram for malignant neoplasm of breast: Secondary | ICD-10-CM

## 2023-12-29 NOTE — Patient Instructions (Signed)
 I value your feedback and you entrusting [place] with your care. If you get a [place] patient survey, I would appreciate you taking the time to let us know about your experience today. Thank you! ? ? ?

## 2023-12-31 LAB — CYTOLOGY - PAP
Comment: NEGATIVE
Diagnosis: NEGATIVE
High risk HPV: NEGATIVE

## 2024-01-01 ENCOUNTER — Other Ambulatory Visit

## 2024-01-01 DIAGNOSIS — Z13220 Encounter for screening for lipoid disorders: Secondary | ICD-10-CM

## 2024-01-01 DIAGNOSIS — R7303 Prediabetes: Secondary | ICD-10-CM

## 2024-01-01 DIAGNOSIS — Z0000 Encounter for general adult medical examination without abnormal findings: Secondary | ICD-10-CM

## 2024-01-02 ENCOUNTER — Ambulatory Visit: Payer: Self-pay | Admitting: Obstetrics and Gynecology

## 2024-01-02 LAB — LIPID PANEL
Chol/HDL Ratio: 3.6 ratio (ref 0.0–4.4)
Cholesterol, Total: 208 mg/dL — ABNORMAL HIGH (ref 100–199)
HDL: 57 mg/dL (ref >39)
LDL Chol Calc (NIH): 119 mg/dL — ABNORMAL HIGH (ref 0–99)
Triglycerides: 181 mg/dL — ABNORMAL HIGH (ref 0–149)
VLDL Cholesterol Cal: 32 mg/dL (ref 5–40)

## 2024-01-02 LAB — CBC WITH DIFFERENTIAL/PLATELET
Basophils Absolute: 0.1 x10E3/uL (ref 0.0–0.2)
Basos: 1 %
EOS (ABSOLUTE): 0.1 x10E3/uL (ref 0.0–0.4)
Eos: 2 %
Hematocrit: 44.6 % (ref 34.0–46.6)
Hemoglobin: 14.6 g/dL (ref 11.1–15.9)
Immature Grans (Abs): 0 x10E3/uL (ref 0.0–0.1)
Immature Granulocytes: 0 %
Lymphocytes Absolute: 3.3 x10E3/uL — ABNORMAL HIGH (ref 0.7–3.1)
Lymphs: 40 %
MCH: 29.6 pg (ref 26.6–33.0)
MCHC: 32.7 g/dL (ref 31.5–35.7)
MCV: 90 fL (ref 79–97)
Monocytes Absolute: 0.7 x10E3/uL (ref 0.1–0.9)
Monocytes: 8 %
Neutrophils Absolute: 4 x10E3/uL (ref 1.4–7.0)
Neutrophils: 49 %
Platelets: 253 x10E3/uL (ref 150–450)
RBC: 4.94 x10E6/uL (ref 3.77–5.28)
RDW: 11.9 % (ref 11.7–15.4)
WBC: 8.1 x10E3/uL (ref 3.4–10.8)

## 2024-01-02 LAB — COMPREHENSIVE METABOLIC PANEL WITH GFR
ALT: 24 IU/L (ref 0–32)
AST: 24 IU/L (ref 0–40)
Albumin: 4.1 g/dL (ref 3.8–4.9)
Alkaline Phosphatase: 94 IU/L (ref 49–135)
BUN/Creatinine Ratio: 15 (ref 9–23)
BUN: 12 mg/dL (ref 6–24)
Bilirubin Total: 0.9 mg/dL (ref 0.0–1.2)
CO2: 21 mmol/L (ref 20–29)
Calcium: 10.2 mg/dL (ref 8.7–10.2)
Chloride: 104 mmol/L (ref 96–106)
Creatinine, Ser: 0.82 mg/dL (ref 0.57–1.00)
Globulin, Total: 2.7 g/dL (ref 1.5–4.5)
Glucose: 99 mg/dL (ref 70–99)
Potassium: 4.4 mmol/L (ref 3.5–5.2)
Sodium: 141 mmol/L (ref 134–144)
Total Protein: 6.8 g/dL (ref 6.0–8.5)
eGFR: 84 mL/min/1.73 (ref >59)

## 2024-01-02 LAB — HEMOGLOBIN A1C
Est. average glucose Bld gHb Est-mCnc: 126 mg/dL
Hgb A1c MFr Bld: 6 % — ABNORMAL HIGH (ref 4.8–5.6)

## 2024-01-14 ENCOUNTER — Ambulatory Visit
Admission: RE | Admit: 2024-01-14 | Discharge: 2024-01-14 | Disposition: A | Source: Ambulatory Visit | Attending: Obstetrics and Gynecology

## 2024-01-14 DIAGNOSIS — Z1231 Encounter for screening mammogram for malignant neoplasm of breast: Secondary | ICD-10-CM | POA: Insufficient documentation

## 2024-01-22 ENCOUNTER — Ambulatory Visit: Payer: Self-pay | Admitting: Obstetrics and Gynecology

## 2024-02-24 ENCOUNTER — Ambulatory Visit: Admitting: Nurse Practitioner

## 2024-02-24 ENCOUNTER — Encounter: Payer: Self-pay | Admitting: Nurse Practitioner

## 2024-02-24 VITALS — BP 128/86 | HR 88 | Temp 98.0°F | Resp 16 | Ht 60.0 in | Wt 205.4 lb

## 2024-02-24 DIAGNOSIS — R7303 Prediabetes: Secondary | ICD-10-CM | POA: Insufficient documentation

## 2024-02-24 DIAGNOSIS — E782 Mixed hyperlipidemia: Secondary | ICD-10-CM | POA: Insufficient documentation

## 2024-02-24 NOTE — Progress Notes (Signed)
 [facility] [address] [place], [place] [address]  Internal MEDICINE  Office Visit Note  Patient Name: Susan Duncan  [id number]  [id number]  Date of Service: 02/24/2024   Complaints/HPI Pt is here for establishment of PCP. Chief Complaint  Patient presents with   New Patient (Initial Visit)    Est care    HPI Susan Duncan presents for a new patient visit to establish care.  Well-appearing 54 y.o.  female with prediabetes and high cholesterol and history of ocular shingles.  Significant FH: diabetes, stroke Work: chemical engineer Home: live at home with husband.  Diet: work-in-progress Exercise: walking  Tobacco use: none  Alcohol use: none  Illicit drug use: none  Routine CRC screening: due in 2032 Routine mammogram: due in december DEXA scan: not due yet  Pap smear: sees OBGYN, due in 2030 Eye exam and/or foot exam: Labs: done in November recently  New or worsening pain: none    Current Medication: Outpatient Encounter Medications as of 02/24/2024  Medication Sig   Ascorbic Acid (VITAMIN C PO) Take by mouth daily.   BIOTIN PO Take by mouth daily.   Multiple Vitamins-Minerals (ZINC PO) Take by mouth.   VITAMIN D  PO Take by mouth daily. Vitamin D  plus K   VITAMIN E PO Take by mouth daily.   VITAMIN K PO Take by mouth.   No facility-administered encounter medications on file as of 02/24/2024.    Surgical History: Past Surgical History:  Procedure Laterality Date   ADENOIDECTOMY     CERVICAL BIOPSY  W/ LOOP ELECTRODE EXCISION     CESAREAN SECTION     COLONOSCOPY WITH PROPOFOL  N/A 05/18/2020   Procedure: COLONOSCOPY WITH PROPOFOL ;  Surgeon: Jinny Carmine, MD;  Location: [facility];  Service: Endoscopy;  Laterality: N/A;  priority 4   WISDOM TOOTH EXTRACTION      Medical History: Past Medical History:  Diagnosis Date   Wears contact lenses    sometimes    Family History: Family History  Problem Relation Age of Onset    Diabetes Father    Cancer Paternal Aunt        not sure type of cancer   Cancer - Other Paternal Uncle        Spinal   Alzheimer's disease Maternal Grandmother    Cancer Maternal Grandfather        larynx neoplasm, malignant   Heart disease Paternal Grandfather    Breast cancer Neg Hx     Social History   Socioeconomic History   Marital status: Married    Spouse name: Not on file   Number of children: Not on file   Years of education: Not on file   Highest education level: Not on file  Occupational History   Not on file  Tobacco Use   Smoking status: Never   Smokeless tobacco: Never  Vaping Use   Vaping status: Never Used  Substance and Sexual Activity   Alcohol use: No   Drug use: No   Sexual activity: Yes    Birth control/protection: Post-menopausal  Other Topics Concern   Not on file  Social History Narrative   Not on file   Social Drivers of Health   Tobacco Use: Low Risk (02/24/2024)   Patient History    Smoking Tobacco Use: Never    Smokeless Tobacco Use: Never    Passive Exposure: Not on file  Financial Resource Strain: Not on file  Food Insecurity: Not on file  Transportation  Needs: Not on file  Physical Activity: Not on file  Stress: Not on file  Social Connections: Not on file  Intimate Partner Violence: Not on file  Depression (PHQ2-9): Low Risk (02/24/2024)   Depression (PHQ2-9)    PHQ-2 Score: 0  Alcohol Screen: Not on file  Housing: Not on file  Utilities: Not on file  Health Literacy: Not on file     Review of Systems  Constitutional:  Positive for unexpected weight change. Negative for chills and fatigue.  HENT:  Negative for congestion, postnasal drip, rhinorrhea, sneezing and sore throat.   Eyes:  Negative for redness.  Respiratory: Negative.  Negative for cough, chest tightness and shortness of breath.   Cardiovascular: Negative.  Negative for chest pain and palpitations.  Gastrointestinal: Negative.  Negative for abdominal pain,  constipation, diarrhea, nausea and vomiting.  Genitourinary:  Negative for dysuria and frequency.  Musculoskeletal: Negative.  Negative for arthralgias, back pain, joint swelling and neck pain.  Skin: Negative.  Negative for rash.  Neurological: Negative.  Negative for tremors and numbness.  Hematological:  Negative for adenopathy. Does not bruise/bleed easily.  Psychiatric/Behavioral:  Negative for behavioral problems (Depression), sleep disturbance and suicidal ideas. The patient is not nervous/anxious.     Vital Signs: BP 128/86   Pulse 88   Temp 98 F (36.7 C)   Resp 16   Ht 5' (1.524 m)   Wt 205 lb 6.4 oz (93.2 kg)   LMP 09/17/2015   SpO2 96%   BMI 40.11 kg/m    Physical Exam Vitals reviewed.  Constitutional:      General: She is not in acute distress.    Appearance: Normal appearance. She is obese. She is not ill-appearing.  HENT:     Head: Normocephalic and atraumatic.  Eyes:     Pupils: Pupils are equal, round, and reactive to light.  Cardiovascular:     Rate and Rhythm: Normal rate and regular rhythm.  Pulmonary:     Effort: Pulmonary effort is normal. No respiratory distress.  Neurological:     Mental Status: She is alert and oriented to person, place, and time.  Psychiatric:        Mood and Affect: Mood normal.        Behavior: Behavior normal.       Assessment/Plan: 1. Prediabetes (Primary) Stable, had labs done in November.   2. Mixed hyperlipidemia Working on diet, has cut out fried foods. Last checked in November 2025.   3. Morbid obesity (HCC) Working on diet and exercise on her own, declines assistance for now. Will schedule visit if she is having difficulty and wants to discuss weight loss management further.     General Counseling: charlie char understanding of the findings of todays visit and agrees with plan of treatment. I have discussed any further diagnostic evaluation that may be needed or ordered today. We also reviewed her  medications today. she has been encouraged to call the office with any questions or concerns that should arise related to todays visit.    No orders of the defined types were placed in this encounter.   No orders of the defined types were placed in this encounter.   Return in about 1 month (around 03/26/2024) for CPE, Raja Liska PCP and otherwise as needed. .  Time spent:30  Minutes Time spent with patient included reviewing progress notes, labs, imaging studies, and discussing plan for follow up.   [place] Controlled Substance Database was reviewed by me for overdose risk  score (ORS)   This patient was seen by Mardy Maxin, FNP-C in collaboration with Dr. Sigrid Bathe as a part of collaborative care agreement.   Janiyha Montufar R. Maxin, MSN, FNP-C Internal Medicine

## 2024-03-31 ENCOUNTER — Encounter: Admitting: Nurse Practitioner
